# Patient Record
Sex: Female | Born: 1945 | Race: White | Hispanic: No | Marital: Married | Smoking: Never smoker
Health system: Southern US, Academic
[De-identification: ages and names within clinical notes are randomized; demographics above are authoritative.]

## PROBLEM LIST (undated history)

## (undated) HISTORY — PX: HX HIP SURGERY: 2100001310

## (undated) HISTORY — PX: HX BACK SURGERY: SHX140

## (undated) HISTORY — PX: HX KNEE SURGERY: 2100001320

---

## 1989-04-24 ENCOUNTER — Other Ambulatory Visit (HOSPITAL_COMMUNITY): Payer: Self-pay

## 2014-10-25 DIAGNOSIS — I82409 Acute embolism and thrombosis of unspecified deep veins of unspecified lower extremity: Secondary | ICD-10-CM | POA: Insufficient documentation

## 2015-02-04 DIAGNOSIS — I10 Essential (primary) hypertension: Secondary | ICD-10-CM | POA: Insufficient documentation

## 2015-12-13 DIAGNOSIS — K219 Gastro-esophageal reflux disease without esophagitis: Secondary | ICD-10-CM | POA: Insufficient documentation

## 2015-12-13 DIAGNOSIS — F319 Bipolar disorder, unspecified: Secondary | ICD-10-CM | POA: Insufficient documentation

## 2016-08-02 DIAGNOSIS — N183 Chronic kidney disease, stage 3 unspecified (CMS HCC): Secondary | ICD-10-CM | POA: Insufficient documentation

## 2018-03-31 IMAGING — MG 3D SCREENING MAMMO BIL W/CAD
5 series · 6 of 24 positions shown · non-contrast
Comparison: 07/14/2018 and 08/09/2014.

------------- REPORT [id number] -------------
PATIENT REGISTRATION FORM

Scheduled Modality:
MG
Date Scheduled:
Referring Physician: 
PATIENT INFORMATION
 Mr.
 Mrs.
 Miss  Ms.
Email address:
[place], [place]
Social Security:  
Age:
Sex:
F
Mailing Address:
Home Phone
[place] [address]
Study Type:
Diagnosis / Symptoms:
Insurance Authorization:
PREV, REVOKICH, FAXING ORDER
Notes/Special Instructions: 
INSURANCE INFORMATION
(Please give your insurance card to the receptionist.)
Name of  primary insurance
[employer] [place]
Subscriber’s S.S. #
Group #
Insurance ID # 
Co-payment:
$
Patient’s relationship to subscriber:
 Self
 Spouse
 Child
 Other
Name of secondary insurance (if applicable):
Insurance ID #
[employer] [place]
EMILIJANO
IN CASE OF EMERGENCY
Name of friend or relative to call in case of emergency:
Relationship to patient:
Home phone #
Work phone #
The above information is true to the best of my knowledge. I authorize my insurance benefits be paid directly to the physician. I understand that I am financially responsible for any balance. I also authorize [employer] or insurance company to release any information required to process my claims.
Patient/Guardian signature
Date
------------- REPORT [id number] -------------
[facility]
[address]
Daina Ms.MULULU, ERKKISON:
We wish to report the following on your recent mammography examination. We are sending a report to your referring physician or other health care provider. 
(       Normal/Negative:
No evidence of cancer.
This statement is mandated by the [place], Department of Health.
Your examination was performed by one of our technologists, who are registered radiological technologists and also specially certified in mammography:
___
Parlak, Edaly (M)
Dang, Mcalex (M)
Your mammogram was interpreted by our radiologist.
( 
Sofeine Made, M.D.
(Annual Breast Examination by a physician or other health care provider
(Annual Mammography Screening beginning at age 40
(Monthly Breast Self Examination
------------- REPORT [id number] -------------
EXAM:  3D BILATERAL ANNUAL SCREENING DIGITAL MAMMOGRAM WITH TOMOSYNTHESIS AND CAD
INDICATION: Screening.

[Series 4211: R CC · right · 0.10mm/px · 2 of 2 slices shown]
[im 1/2]
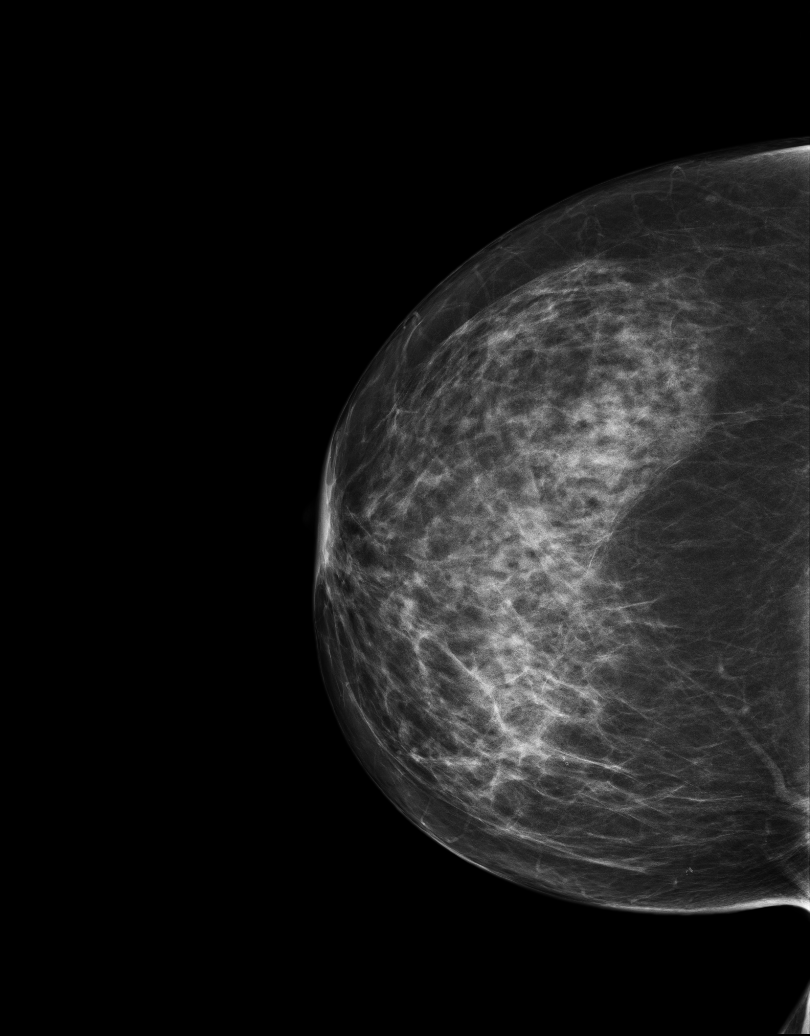
[im 2/2]
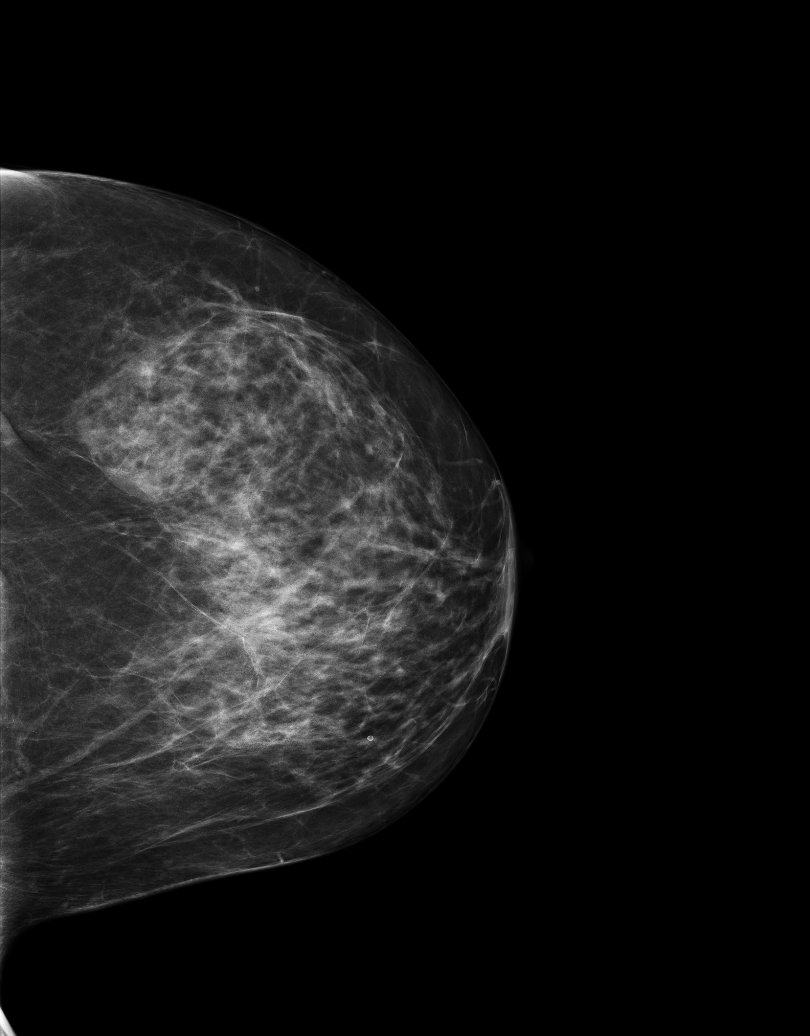

[3D SCREENING MAMMO BIL W/CAD (1 of 2) · tomo slice 13/80.0]
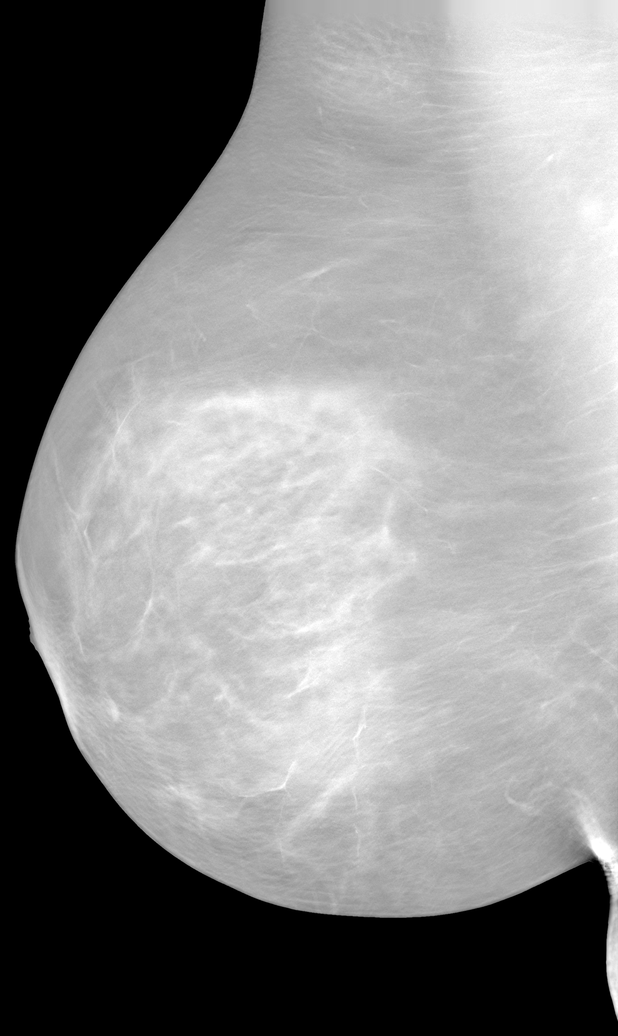

[R]
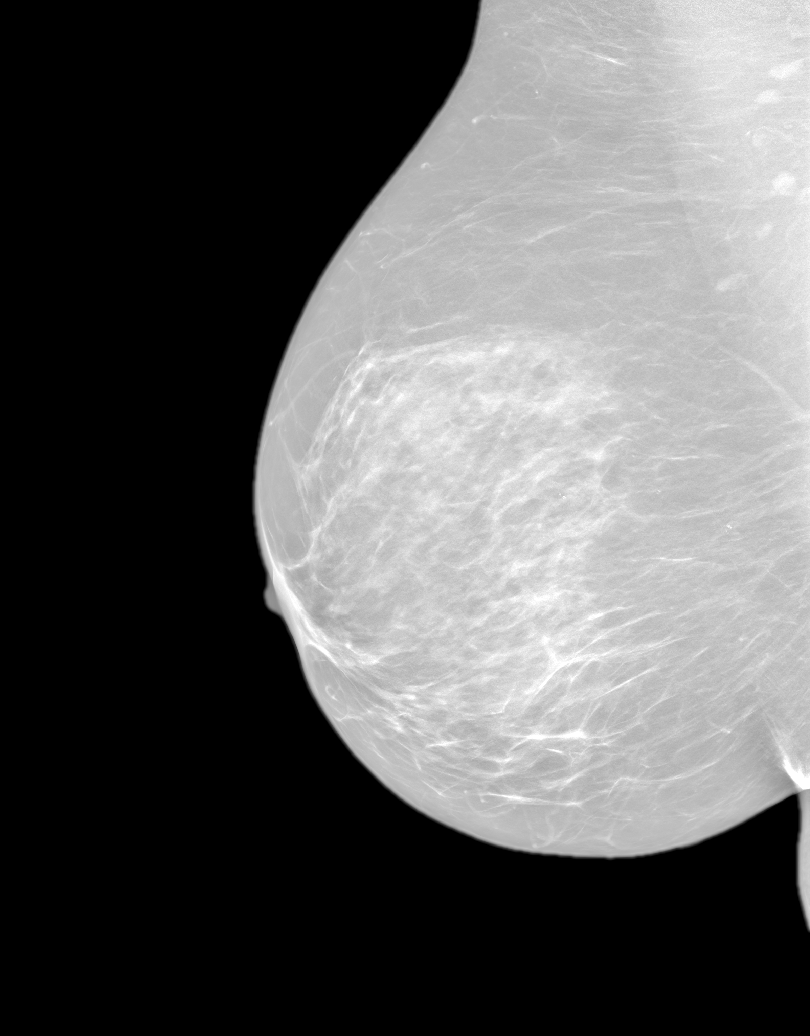

[3D SCREENING MAMMO BIL W/CAD (2 of 2) · tomo slice 12/75.0]
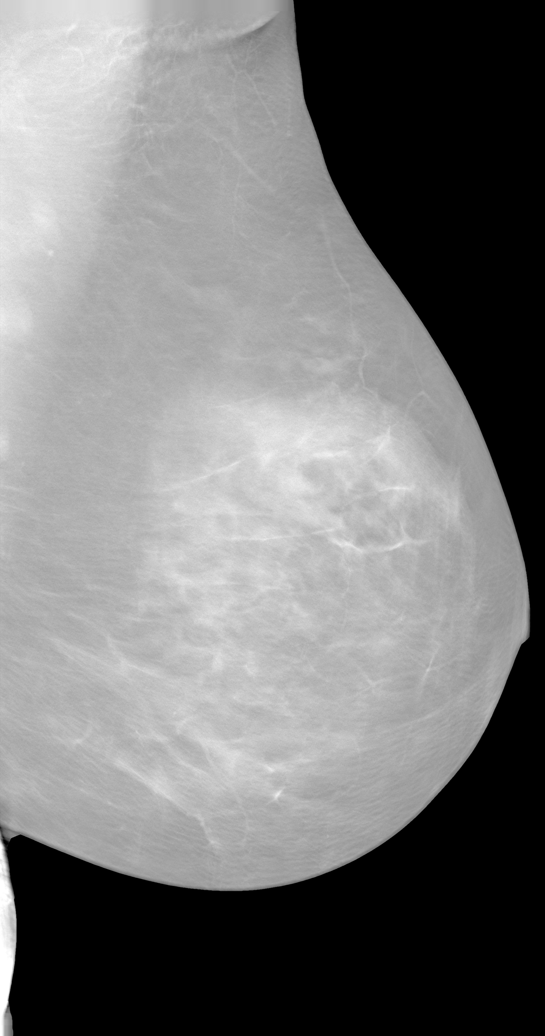

[L]
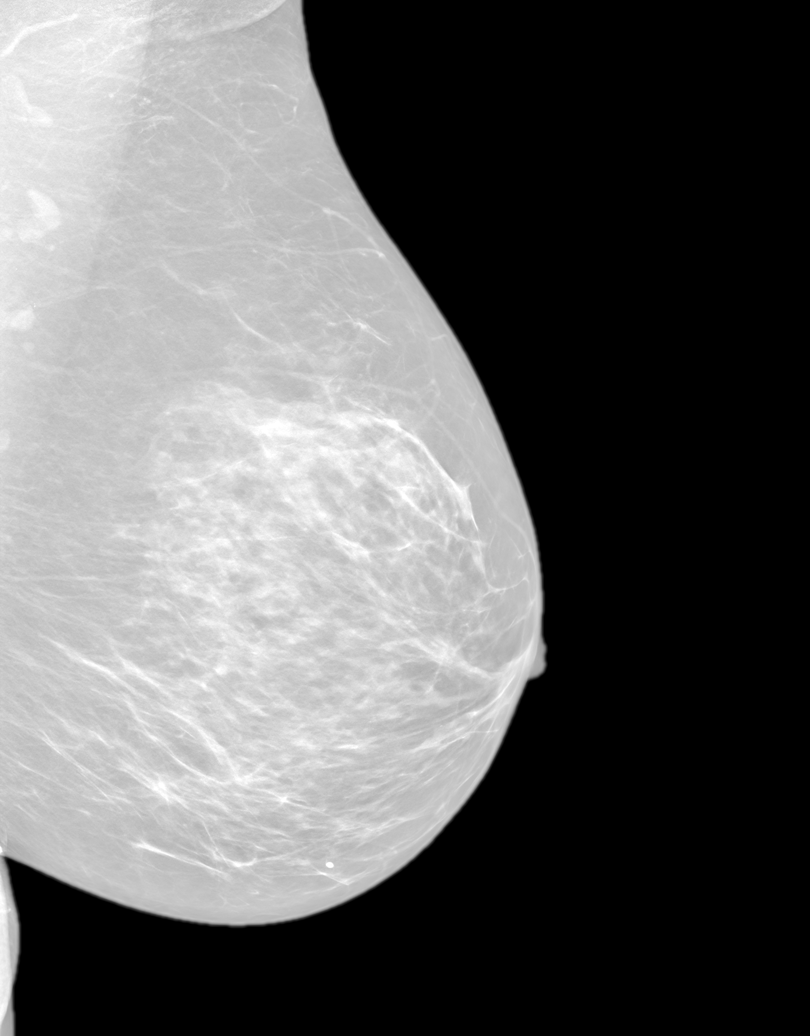

[6 of 24 positions shown; findings below may reference images not displayed]

FINDINGS: Breast parenchyma is heterogeneously dense.  There is no mass or suspicious cluster of microcalcifications.   There is no architectural distortion, skin thickening or nipple retraction.
IMPRESSION: 1.  BIRADS 2-Benign findings. Patient has been added in a reminder system with a target date for the next screening mammography.

2.  DENSITY CODE – C (Heterogeneously dense).  

Final Assessment Code:

Bi-Rads 2 

BI-RADS 0
Need additional imaging evaluation

BI-RADS 1
Negative mammogram

BI-RADS 2
Benign finding

BI-RADS 3
Probably benign finding: short-interval follow-up suggested

BI-RADS 4
Suspicious abnormality:  biopsy should be considered

BI-RADS 5
Highly suggestive of malignancy; appropriate action should be taken

BI-RADS 6
Known Biopsy-proven Malignancy – Appropriate action should be taken

NOTE:
In compliance with Federal regulations, the results of this mammogram are being sent to the patient.

## 2020-07-05 IMAGING — MG 3D SCREENING MAMMO BIL W/CAD
5 series · 7 of 24 positions shown · non-contrast
Comparison: Mammograms dated 10/06/2018, 10/13/2013.

------------- REPORT [id number] -------------
[facility]
[address]
Daina Ms.MULULU, ERKKISON:
We wish to report the following on your recent mammography examination. We are sending a report to your referring physician or other health care provider. 
(       Normal/Negative:
No evidence of cancer.
This statement is mandated by the [place], Department of Health.
Your examination was performed by one of our technologists, who are registered radiological technologists and also specially certified in mammography:
___
Parlak, Edaly (M)
Nepomuceno, Martinez (M)

Your mammogram was interpreted by our radiologist.
( 
Sofeine Made, M.D.
(Annual Breast Examination by a physician or other health care provider
(Annual Mammography Screening beginning at age 40
(Monthly Breast Self Examination
------------- REPORT [id number] -------------
EXAM:  BILATERAL DIGITAL SCREENING MAMMOGRAM WITH 3D TOMOSYNTHESIS AND COMPUTER ASSISTED DIAGNOSIS
INDICATION: Asymptomatic 73-year-old with family history of breast cancer in her sister.  Lifetime breast cancer risk 8.2%.
TECHNIQUE: 2D mammogram in CC projection both breasts. 3D Tomosynthesis of both breasts in the MLO projection. Virtual 2D images were obtained from the 3D images in the MLO projection.

[L]
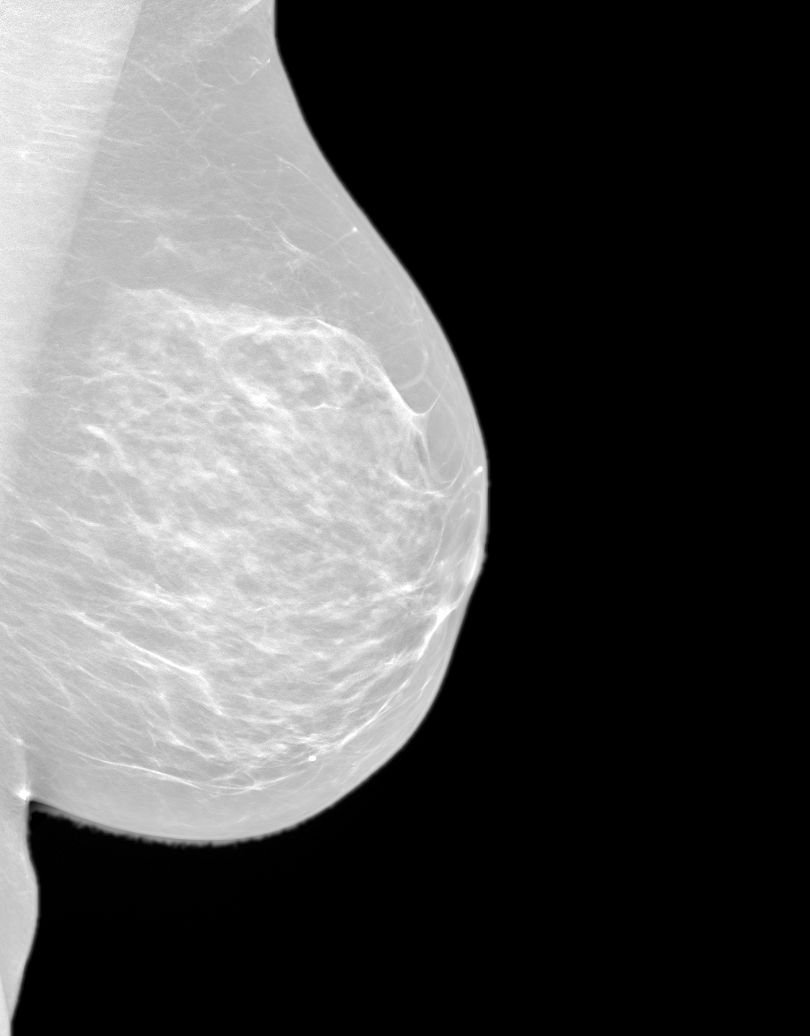

[Series 8021: R CC · right · 2 of 2 slices shown]
[im 1/2]
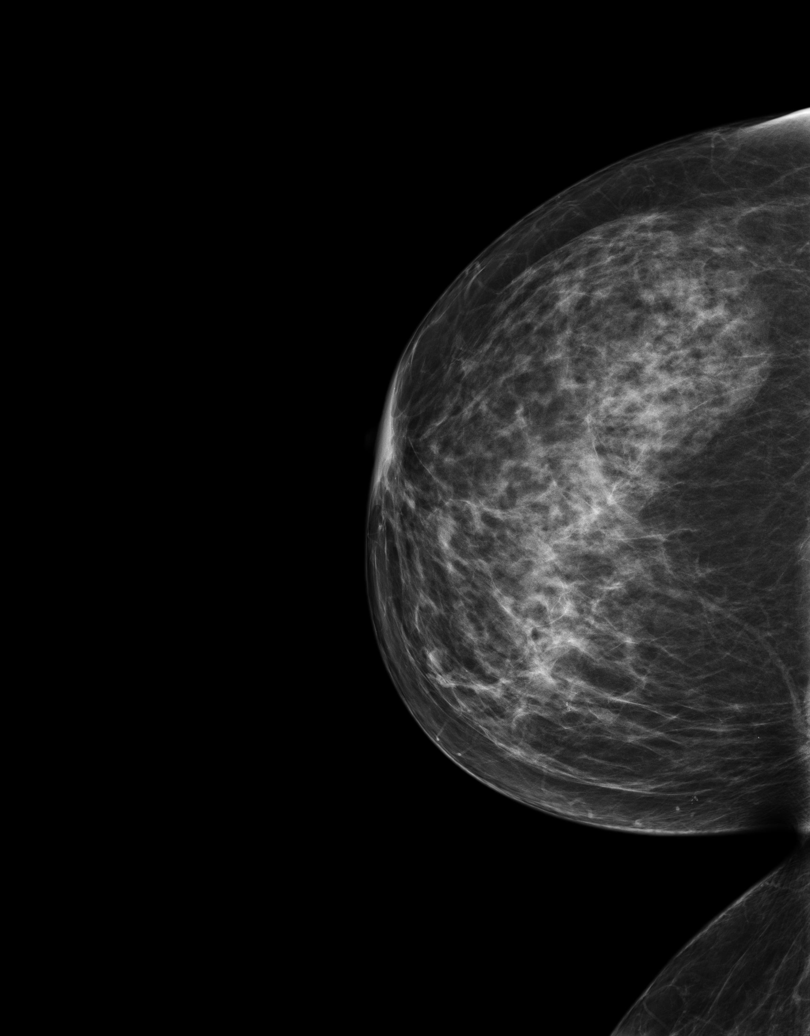
[im 2/2]
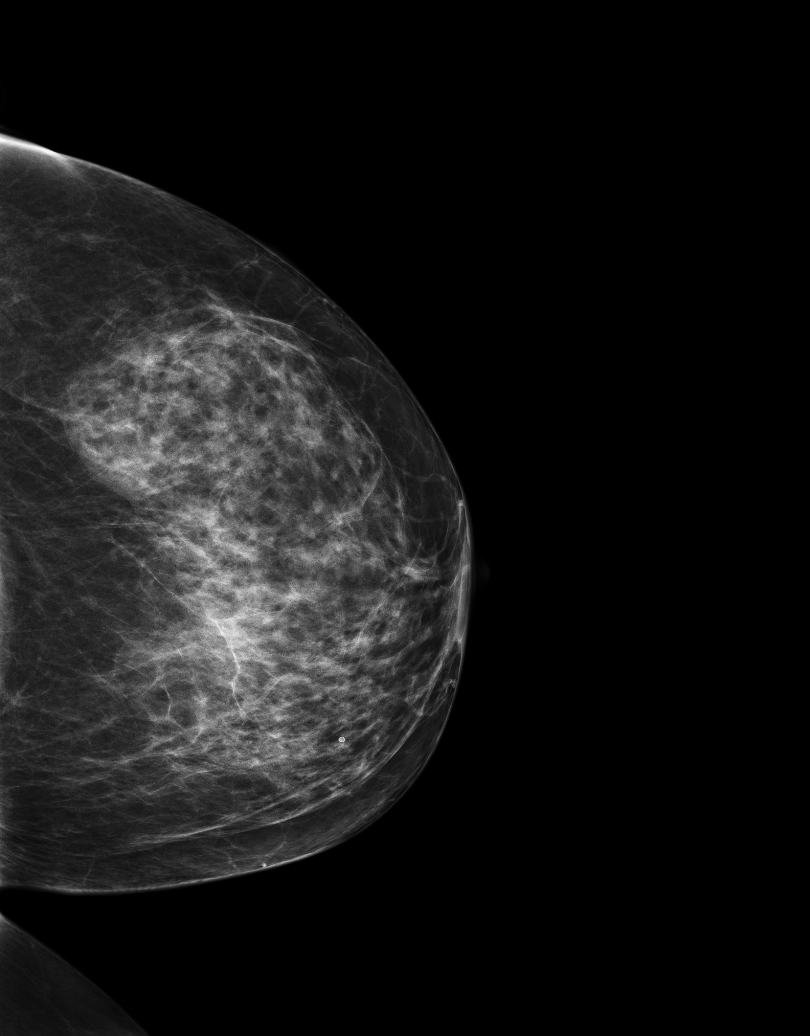

[Series 8023: 3D SCREENING MAMMO BIL W/CAD · 2 acquisitions, 2 frames shown (1 of 2)]
[im 1/2]
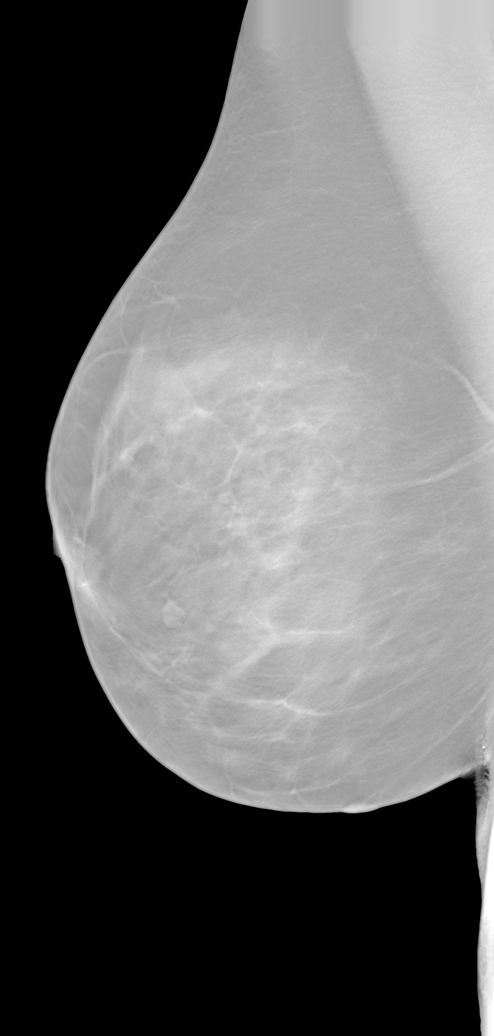
[im 2/2]
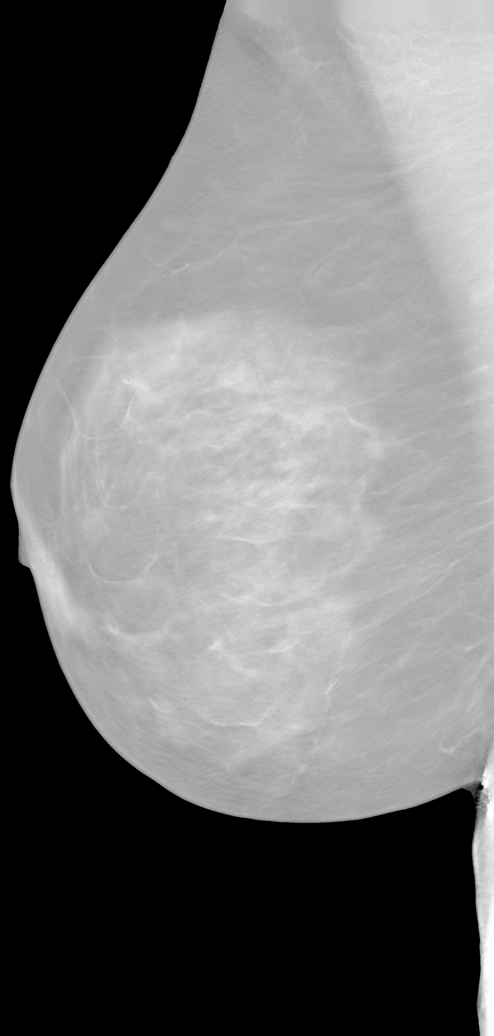

[3D SCREENING MAMMO BIL W/CAD (2 of 2) · tomo slice 11/70.0]
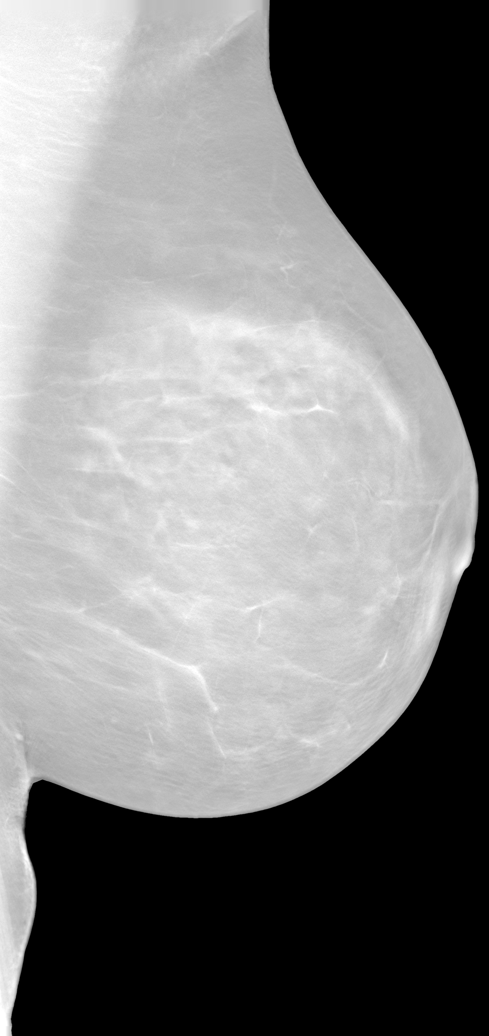

[R]
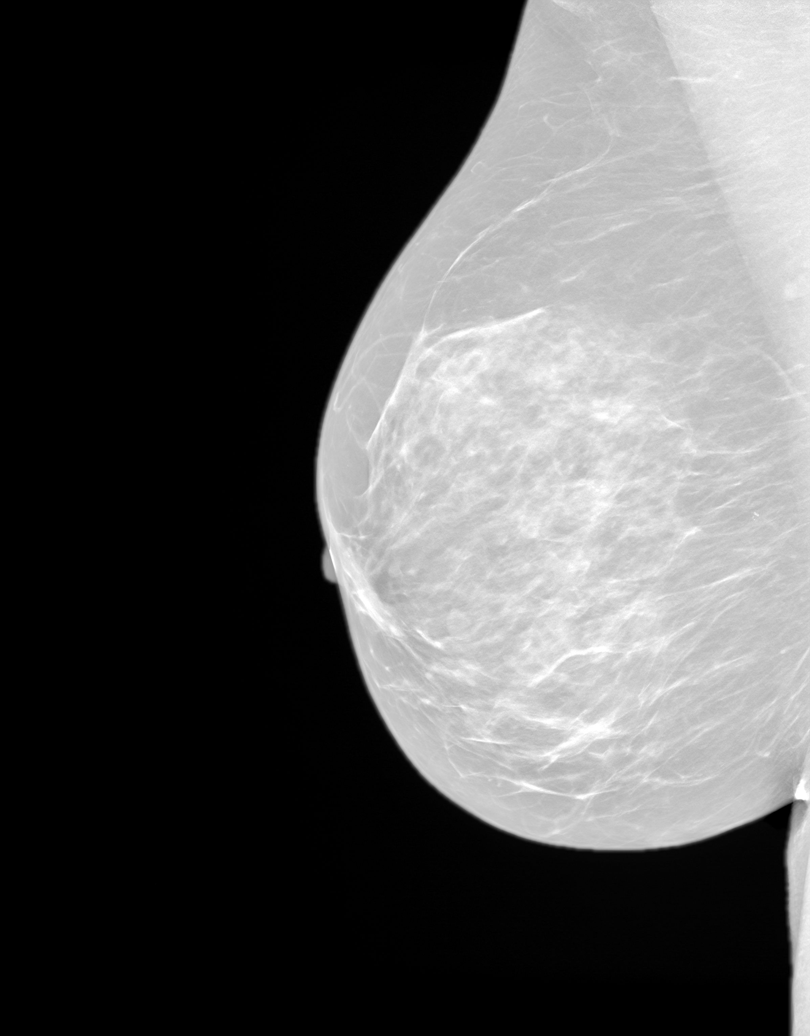

[7 of 24 positions shown; findings below may reference images not displayed]

FINDINGS: No focal mass or architectural changes are noted.  Asymmetry of density in the superior left breast compared to the right is stable.  No abnormal calcific densities, skin changes, nipple changes or duct dilation are seen.
IMPRESSION: 1.  Stable mammographic findings.  Heterogeneously dense breasts limits sensitivity to mammogram.  

2.  BIRADS 2-Benign findings. Patient has been added in a reminder system with a target date for the next screening mammography.

3.  DENSITY CODE – C (Heterogeneously dense). 

Final Assessment Code:

Bi-Rads 2 

BI-RADS 0
Need additional imaging evaluation.

BI-RADS 1
Negative mammogram.

BI-RADS 2
Benign finding.

BI-RADS 3
Probably benign finding; short-interval follow-up suggested.

BI-RADS 4
Suspicious abnormality; biopsy should be considered.

BI-RADS 5
Highly suggestive of malignancy; appropriate action should be taken.

BI-RADS 6
Known biopsy-proven malignancy; appropriate action should be taken. 

NOTE:
In compliance with Federal regulations, the results of this mammogram are being sent to the patient.

## 2021-04-18 IMAGING — MR MRI CERVICAL SPINE WITHOUT CONTRAST
6 series · 28 of 48 positions shown · IV contrast (gadolinium)
Comparison: None available.

﻿EXAM:  [id number]   MRI CERVICAL SPINE WITHOUT CONTRAST
INDICATION: Neck pain with right upper extremity radiculopathy.
TECHNIQUE: Multiplanar multisequential MRI of the cervical spine was performed without gadolinium contrast.

[Series 4: s-map · sagittal · 8.8mm · 4.38mm/px · 8 of 100 slices shown]
[im 4/100]
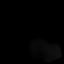
[im 16/100]
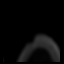
[im 32/100]
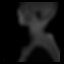
[im 44/100]
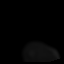
[im 56/100]
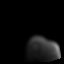
[im 68/100]
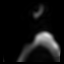
[im 84/100]
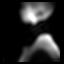
[im 96/100]
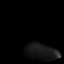

[Series 5: T2 · sagittal · 3.0mm · 0.75mm/px · 4 of 15 slices shown (1 of 2)]
[im 1/15]
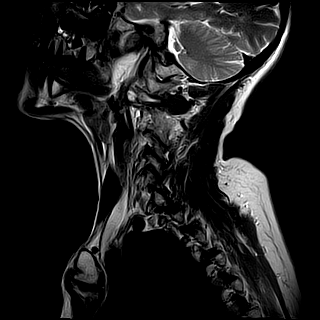
[im 5/15]
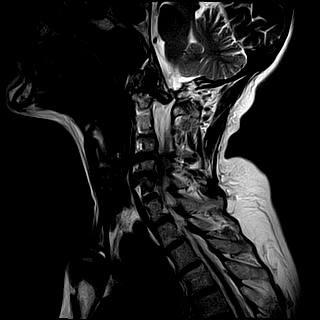
[im 10/15]
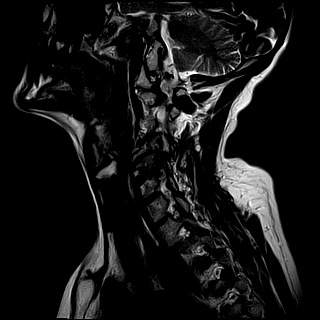
[im 15/15]
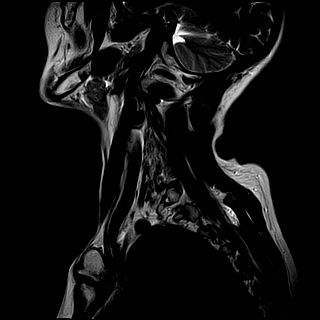

[Series 6: T1 · sagittal · 3.0mm · 0.47mm/px · 4 of 15 slices shown]
[im 1/15]
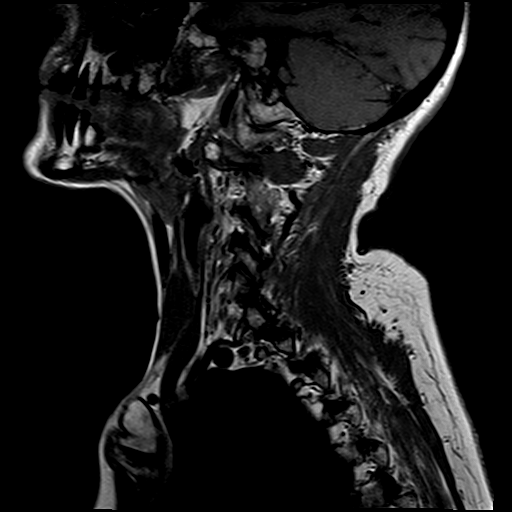
[im 5/15]
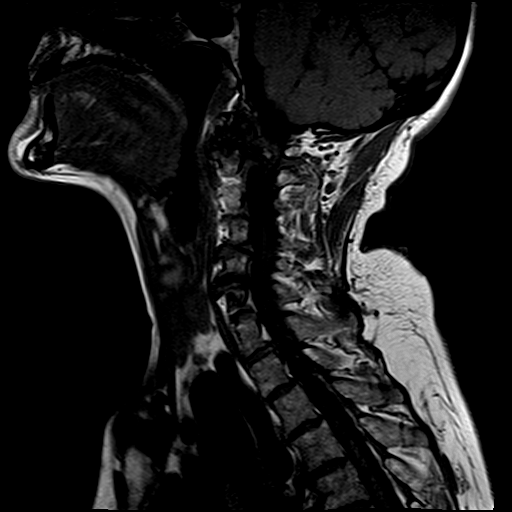
[im 10/15]
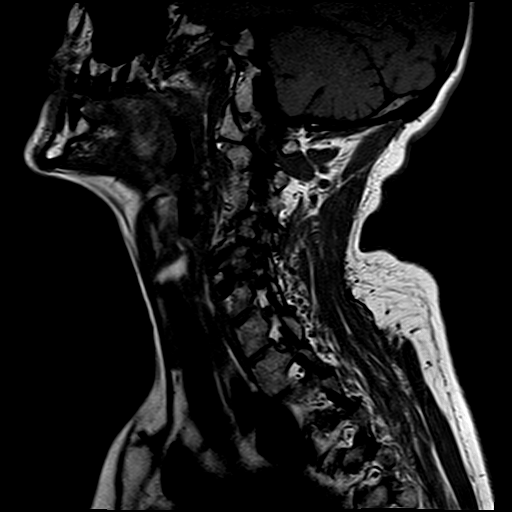
[im 15/15]
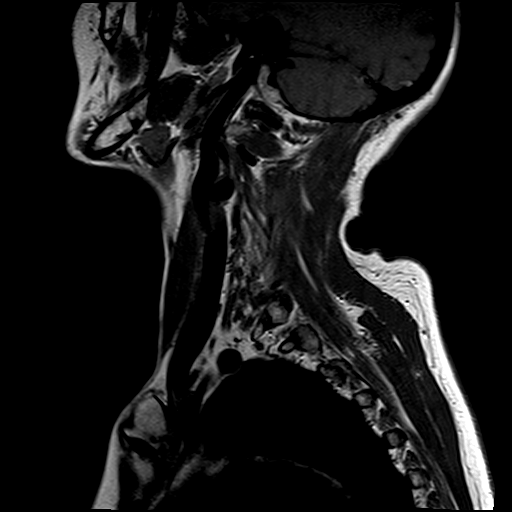

[Series 7: STIR · sagittal · 3.0mm · 0.47mm/px · 4 of 15 slices shown]
[im 1/15]
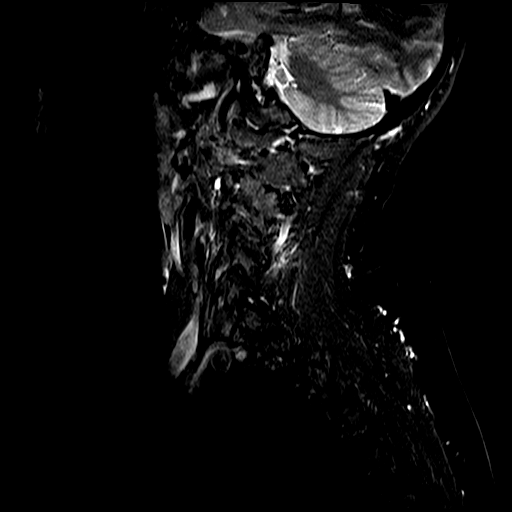
[im 5/15]
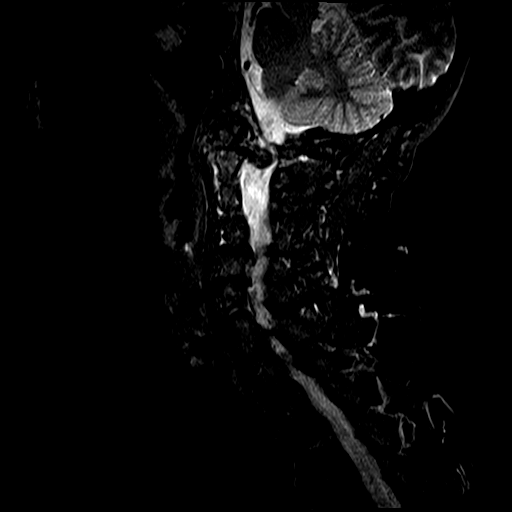
[im 10/15]
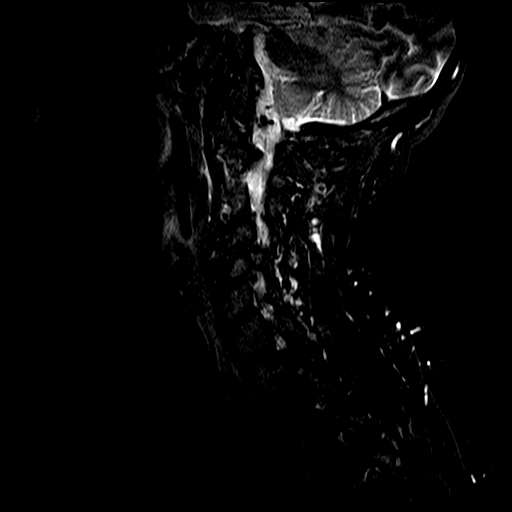
[im 15/15]
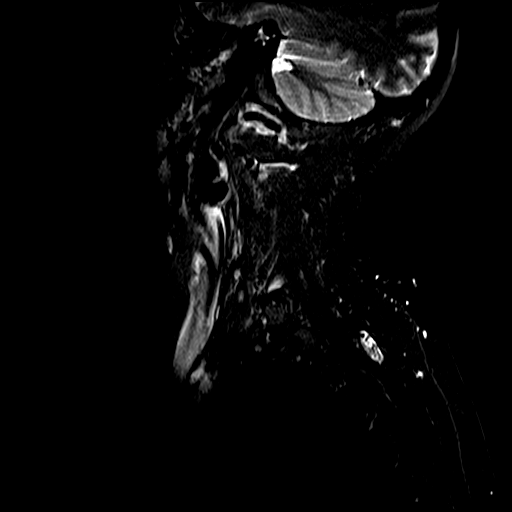

[Series 8: T2-star · axial · 3.0mm · 0.39mm/px · z∈[-98,-37]mm · 3 of 18 slices shown]
[im 1/18]
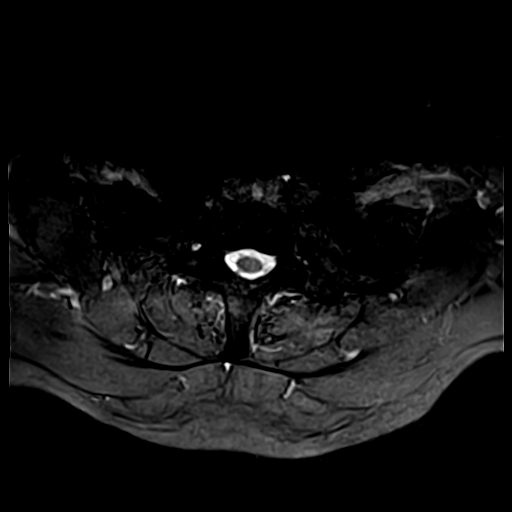
[im 5/18]
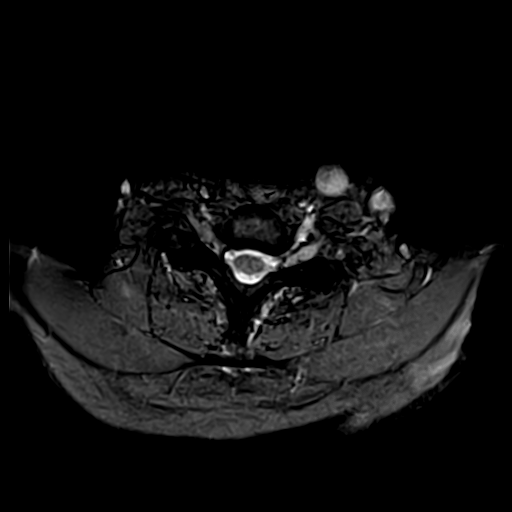
[im 9/18]
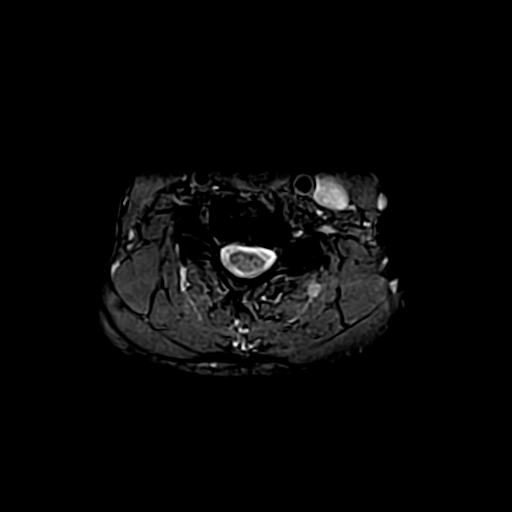

[Series 9: T2 · axial · 3.0mm · 0.39mm/px · z∈[-98,+9]mm · 5 of 18 slices shown (2 of 2)]
[im 1/18]
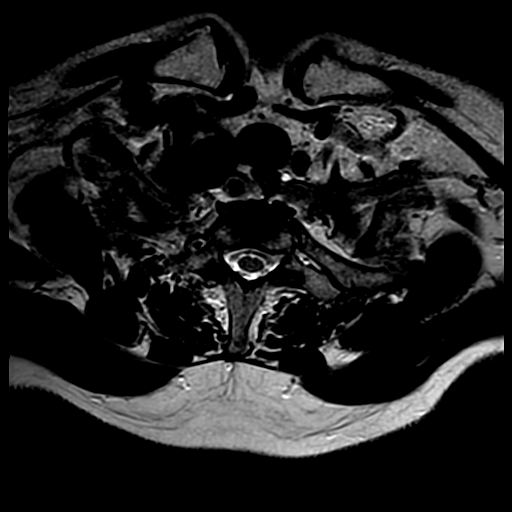
[im 5/18]
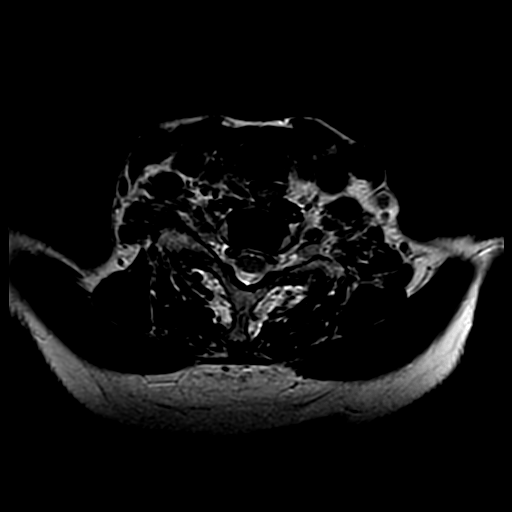
[im 9/18]
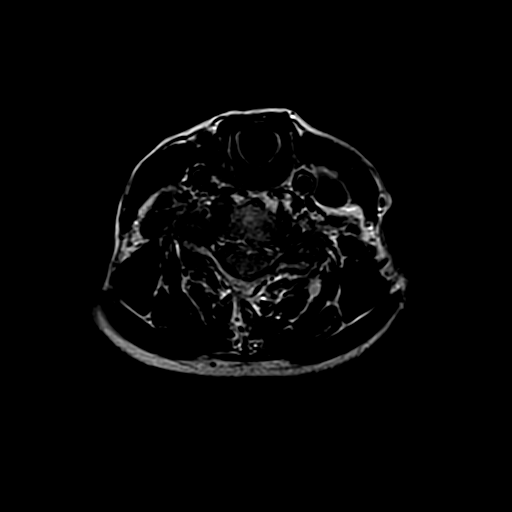
[im 13/18]
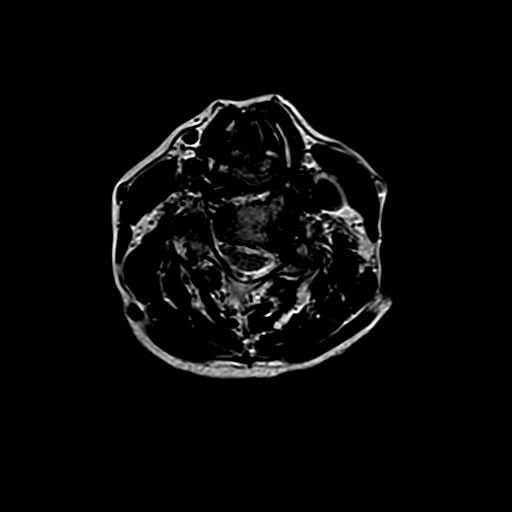
[im 18/18]
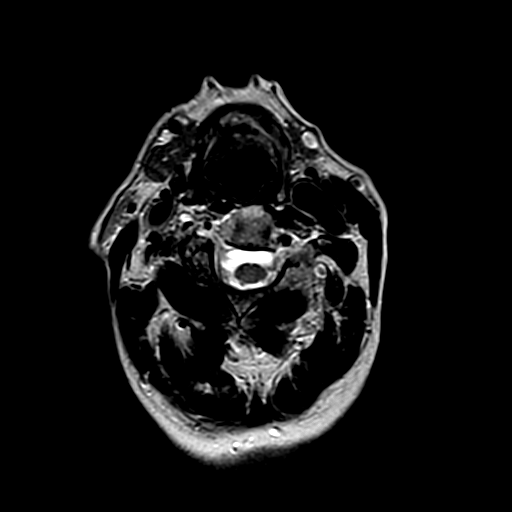

[28 of 48 positions shown; findings below may reference images not displayed]

FINDINGS: Reversal of cervical lordosis is likely positional. Bone marrow signal intensity is normal. There is no acute fracture or subluxation. Visualized spinal cord is also normal in signal intensity without evidence of compression at any level.

C2-3 level is unremarkable.

At C3-4 level, there is minimal anterolisthesis of C3 on C4 vertebral body. There is no significant disc herniation or neural foraminal stenosis.

At C4-5 level, there is marked disc desiccation. There is a small broad-based central disc osteophyte complex with near complete effacement of the ventral CSF. There is severe bilateral neural foraminal stenosis from facet and uncovertebral joint hypertrophy.

At C5-6 level, there is marked disc desiccation. There is a small broad-based central disc osteophyte complex with near complete effacement of the ventral CSF. There is severe right and moderate to severe left neural foraminal stenosis from facet and uncovertebral joint hypertrophy.

At C6-7 level, there is minimal anterolisthesis of C6 on C7 vertebral body. There is no significant disc herniation or neural foraminal stenosis.

C7-T1 level and paraspinal soft tissues are unremarkable.
IMPRESSION: 1. Minimal anterolisthesis of C3 on C4 and C6 on C7 vertebral body. 

2. Near complete effacement of the ventral CSF at C4-5 and C5-6 levels from small central disc osteophyte complexes. 

3. Multilevel neural foraminal stenosis as detailed above.

## 2022-03-09 ENCOUNTER — Encounter (INDEPENDENT_AMBULATORY_CARE_PROVIDER_SITE_OTHER): Payer: Self-pay | Admitting: Nephrology

## 2022-03-09 DIAGNOSIS — M92212 Osteochondrosis (juvenile) of carpal lunate [Kienbock], left hand: Secondary | ICD-10-CM | POA: Insufficient documentation

## 2022-03-09 DIAGNOSIS — E559 Vitamin D deficiency, unspecified: Secondary | ICD-10-CM

## 2022-03-09 DIAGNOSIS — N183 Chronic kidney disease, stage 3 unspecified (CMS HCC): Secondary | ICD-10-CM

## 2022-03-09 DIAGNOSIS — R7303 Prediabetes: Secondary | ICD-10-CM | POA: Insufficient documentation

## 2022-03-09 NOTE — Progress Notes (Signed)
Chronic kidney disease  -Stage IIIA  -Due to age  -Creatinine is stable  -Baseline creatinine 1.1  -We will check UA  -Total protein to creatinine ratio not significant  -CBC and a basic metabolic panel  -Renal ultrasound  -Return to clinic in 6 months  -Continue low-sodium diet  -Fluid restriction less than 40 ounces a day  -Avoid NSAIDs     CKD bone mineral disease  -PTH  -Vitamin D level  Continue to monitor        Hypertension  -Blood pressure is at goal  -Goal less than 130/80  -She is not on lisinopril/ARBs  -Low-salt diet        I have discussed with the patient the following issues:  The main goal of treatment is to prevent progression of CKD to complete kidney failure. The best way to do this is to control the underlying cause.     the optimal diet is one similar to the Dietary Approaches to Stop Hypertension (DASH) diet, consisting of fruits, vegetables, legumes, fish, poultry, and whole grains.     Restrict sodium intake to less than 2 g/day     Restrict  fluids / free water intake to less then 2.0 L/day

## 2022-03-12 ENCOUNTER — Other Ambulatory Visit: Payer: Medicare Other | Attending: Nephrology | Admitting: Nephrology

## 2022-03-12 ENCOUNTER — Other Ambulatory Visit: Payer: Self-pay

## 2022-03-12 ENCOUNTER — Ambulatory Visit (INDEPENDENT_AMBULATORY_CARE_PROVIDER_SITE_OTHER): Payer: Medicare Other

## 2022-03-12 DIAGNOSIS — N183 Chronic kidney disease, stage 3 unspecified (CMS HCC): Secondary | ICD-10-CM

## 2022-03-12 DIAGNOSIS — E559 Vitamin D deficiency, unspecified: Secondary | ICD-10-CM | POA: Insufficient documentation

## 2022-03-12 LAB — BASIC METABOLIC PANEL
ANION GAP: 7 mmol/L — ABNORMAL LOW (ref 10–20)
BUN/CREA RATIO: 23 — ABNORMAL HIGH (ref 6–22)
BUN: 28 mg/dL — ABNORMAL HIGH (ref 7–25)
CALCIUM: 9.9 mg/dL (ref 8.6–10.3)
CHLORIDE: 110 mmol/L — ABNORMAL HIGH (ref 98–107)
CO2 TOTAL: 25 mmol/L (ref 21–31)
CREATININE: 1.2 mg/dL (ref 0.60–1.30)
ESTIMATED GFR: 47 mL/min/{1.73_m2} — ABNORMAL LOW (ref >59)
GLUCOSE: 86 mg/dL (ref 74–109)
OSMOLALITY, CALCULATED: 288 mOsm/kg (ref 270–290)
POTASSIUM: 4.2 mmol/L (ref 3.5–5.1)
SODIUM: 142 mmol/L (ref 136–145)

## 2022-03-12 LAB — CBC WITH DIFF
BASOPHIL #: 0 10*3/uL (ref 0.00–2.50)
BASOPHIL %: 1 % (ref 0–3)
EOSINOPHIL #: 0.1 10*3/uL (ref 0.00–2.40)
EOSINOPHIL %: 2 % (ref 0–7)
HCT: 40.6 % (ref 37.0–47.0)
HGB: 14.5 g/dL (ref 12.5–16.0)
LYMPHOCYTE #: 1.9 10*3/uL — ABNORMAL LOW (ref 2.10–11.00)
LYMPHOCYTE %: 29 % (ref 25–45)
MCH: 33.9 pg — ABNORMAL HIGH (ref 27.0–32.0)
MCHC: 35.6 g/dL (ref 32.0–36.0)
MCV: 95.2 fL (ref 78.0–99.0)
MONOCYTE #: 0.5 10*3/uL (ref 0.00–4.10)
MONOCYTE %: 7 % (ref 0–12)
MPV: 8.5 fL (ref 7.4–10.4)
NEUTROPHIL #: 3.9 10*3/uL — ABNORMAL LOW (ref 4.10–29.00)
NEUTROPHIL %: 61 % (ref 40–76)
PLATELETS: 271 10*3/uL (ref 140–440)
RBC: 4.26 10*6/uL (ref 4.20–5.40)
RDW: 13.7 % (ref 11.6–14.8)
WBC: 6.4 10*3/uL (ref 4.0–10.5)
WBCS UNCORRECTED: 6.4 10*3/uL

## 2022-03-12 LAB — PARATHYROID HORMONE (PTH): PTH: 76.3 pg/mL (ref 12.0–88.0)

## 2022-03-12 LAB — PROTEIN/CREATININE RATIO, URINE, RANDOM
CREATININE RANDOM URINE: 219 mg/dL — ABNORMAL HIGH (ref 11–26)
PROTEIN RANDOM URINE: 13 mg/dL — ABNORMAL LOW (ref 50–80)
PROTEIN/CREATININE RATIO RANDOM URINE: 0.059 mg/mg (ref 0.000–200.000)

## 2022-03-12 LAB — VITAMIN D 25 TOTAL: VITAMIN D: 32 ng/mL (ref 30–100)

## 2022-03-12 LAB — MICROALBUMIN URINE, RANDOM: MICROALBUMIN RANDOM URINE: 1.1 mg/dL

## 2022-03-17 LAB — VITAMIN D CALCITROL, 1,25 HYDROXYVITAMIN D
VITAMIN D,1,25 (OH)2,TOTAL: 46 pg/mL (ref 18–72)
VITAMIN D2, 1,25 (OH)2: <8 pg/mL
VITAMIN D3, 1,25 (OH)2: 46 pg/mL

## 2022-03-20 ENCOUNTER — Other Ambulatory Visit: Payer: Self-pay

## 2022-03-20 ENCOUNTER — Encounter (INDEPENDENT_AMBULATORY_CARE_PROVIDER_SITE_OTHER): Payer: Self-pay | Admitting: Nephrology

## 2022-03-20 ENCOUNTER — Ambulatory Visit (INDEPENDENT_AMBULATORY_CARE_PROVIDER_SITE_OTHER): Payer: Medicare Other | Admitting: Nephrology

## 2022-03-20 VITALS — BP 120/62 | HR 76 | Ht 67.0 in | Wt 174.0 lb

## 2022-03-20 DIAGNOSIS — N1831 Chronic kidney disease, stage 3a (CMS HCC): Secondary | ICD-10-CM

## 2022-03-20 DIAGNOSIS — I10 Essential (primary) hypertension: Secondary | ICD-10-CM

## 2022-03-20 DIAGNOSIS — I129 Hypertensive chronic kidney disease with stage 1 through stage 4 chronic kidney disease, or unspecified chronic kidney disease: Secondary | ICD-10-CM

## 2022-03-20 NOTE — Progress Notes (Signed)
[facility]    Progress Note    Name: Donna Roberts MRN:  [id number]   Date: 03/20/2022 Age: 76 y.o.          Nephrology Follow Up Visit          HPI: 75 y.o. Pleasant female with past medical history of CKD stage IIIA, hypertension, anxiety here for follow-up on chronic kidney disease stage IIIA.  Patient denies nausea or vomiting.  No diarrhea.  Reports chronic edema in the left lower extremity due to blood clots in the past.  Patient denies any other complaints.  No dysuria.  No blood in the urine.  No nausea or vomiting.  Discussed with the patient the findings her lab work all questions were answered.      ROS:         Systematic review of 12 organ systems was negative except what mentioned in in the HPI.      OBJECTIVE:   BP 120/62 (Site: Upper Extremity, Patient Position: Sitting, Cuff Size: Adult)   Pulse 76   Ht 1.702 m (5\' 7" )   Wt 78.9 kg (174 lb)   BMI 27.25 kg/m       General:  NAD, AAOx3  HEENT:  EOMI, MMM, no pallor, no icterus  NECK: No increased JVD.    HEART: Normal S1 and S2. Regular rhythm. No murmurs or rubs.   LUNGS: Clear to auscultation bilateral. No wheezes, rales, or rhonchi.   ABDOMEN: +BS, Soft, nontender and nondistended. No rebound or guarding present.   EXTREMITIES: No edema. No asterixis    NEURO : moving all extremities. emoi  SKIN: No obvious skin rashes.    LABORATORY DATA:   Lab Results   Component Value Date    BUN 28 (H) 03/12/2022    CREATININE 1.20 03/12/2022    BUNCRRATIO 23 (H) 03/12/2022    GFR 47 (L) 03/12/2022    SODIUM 142 03/12/2022    POTASSIUM 4.2 03/12/2022    CHLORIDE 110 (H) 03/12/2022    CO2 25 03/12/2022    ANIONGAP 7 (L) 03/12/2022    CALCIUM 9.9 03/12/2022    HGB 14.5 03/12/2022    HCT 40.6 03/12/2022    INTACTPTH 76.3 03/12/2022       No results found for: MICROALBUMIN, TOTPROTCREAT, HA1C, URICACID         MEDICATIONS:  No outpatient medications have been marked as taking for the 03/20/22 encounter (Office Visit) with  Beather Arbour, MD.     Current Outpatient Medications   Medication Instructions   . ARIPiprazole (ABILIFY) 10 mg Oral Tablet aripiprazole 10 mg tablet   . atenoloL (TENORMIN) 25 mg Oral Tablet atenolol 25 mg tablet   . atorvastatin (LIPITOR) 80 mg Oral Tablet atorvastatin 80 mg tablet   . citalopram (CELEXA) 40 mg Oral Tablet No dose, route, or frequency recorded.   . DULoxetine (CYMBALTA DR) 30 mg Oral Capsule, Delayed Release(E.C.) duloxetine 30 mg capsule,delayed release   . FLUoxetine (PROZAC) 40 mg Oral Capsule fluoxetine 40 mg capsule   . lamoTRIgine (LAMICTAL) 25 mg Oral Tablet lamotrigine 25 mg tablet   . lisinopriL-hydrochlorothiazide (ZESTORETIC) 20-12.5 mg Oral Tablet lisinopril 20 mg-hydrochlorothiazide 12.5 mg tablet   . metoprolol tartrate (LOPRESSOR) 50 mg Oral Tablet metoprolol tartrate 50 mg tablet   . omeprazole (PRILOSEC) 40 mg Oral Capsule, Delayed Release(E.C.) omeprazole 40 mg capsule,delayed release   . QUEtiapine (SEROQUEL) 50 mg Oral Tablet quetiapine 50 mg tablet   .  rivaroxaban (XARELTO) 20 mg, Oral   . sertraline (ZOLOFT) 100 mg Oral Tablet 07/26/2014 Sertraline Oral  PO 1.0 TABLET(S) daily  07/26/2014  active   . tiZANidine (ZANAFLEX) 2 mg Oral Tablet tizanidine 2 mg tablet   . traMADoL (ULTRAM) 50 mg Oral Tablet tramadol 50 mg tablet   . venlafaxine (EFFEXOR XR) 75 mg Oral Capsule, Sust. Release 24 hr venlafaxine ER 75 mg capsule,extended release 24 hr   . Warfarin (COUMADIN) 4 mg Oral Tablet warfarin 4 mg tablet         ASSESSMENT / PLAN:   ENCOUNTER DIAGNOSES     ICD-10-CM   1. Stage 3a chronic kidney disease (CMS HCC)  N18.31   2. HTN (hypertension), benign  I10                 Chronic kidney disease  -Stage IIIA  -Due to age  -Creatinine is stable 1.2 today.  -Baseline creatinine 1.1  -Total protein to creatinine ratio not significant  -CBC and a basic metabolic panel  -Renal ultrasound ordered and not performed may need to order the test again next year.  Condition is stable  -Return  to clinic in 5-6 months  -Continue low-sodium diet  -Avoid NSAIDs     CKD bone mineral disease/stable  -PTH  -Vitamin D level  -Continue to monitor        Hypertension  -Blood pressure is at goal  -Goal less than 130/80  -She is not on lisinopril/ARBs  -Low-salt diet

## 2022-08-14 ENCOUNTER — Other Ambulatory Visit (INDEPENDENT_AMBULATORY_CARE_PROVIDER_SITE_OTHER): Payer: Self-pay

## 2022-08-14 ENCOUNTER — Other Ambulatory Visit (INDEPENDENT_AMBULATORY_CARE_PROVIDER_SITE_OTHER): Payer: Self-pay | Admitting: Nephrology

## 2022-08-14 DIAGNOSIS — N183 Chronic kidney disease, stage 3 unspecified (CMS HCC): Secondary | ICD-10-CM

## 2022-08-14 DIAGNOSIS — E559 Vitamin D deficiency, unspecified: Secondary | ICD-10-CM

## 2022-08-15 ENCOUNTER — Other Ambulatory Visit: Payer: Medicare Other | Attending: Nephrology | Admitting: Nephrology

## 2022-08-15 ENCOUNTER — Ambulatory Visit (INDEPENDENT_AMBULATORY_CARE_PROVIDER_SITE_OTHER): Payer: Medicare Other

## 2022-08-15 ENCOUNTER — Other Ambulatory Visit: Payer: Self-pay

## 2022-08-15 DIAGNOSIS — N183 Chronic kidney disease, stage 3 unspecified (CMS HCC): Secondary | ICD-10-CM

## 2022-08-15 DIAGNOSIS — E559 Vitamin D deficiency, unspecified: Secondary | ICD-10-CM

## 2022-08-15 LAB — CBC WITH DIFF
BASOPHIL #: 0 10*3/uL (ref 0.00–0.30)
BASOPHIL %: 1 % (ref 0–3)
EOSINOPHIL #: 0.2 10*3/uL (ref 0.00–0.80)
EOSINOPHIL %: 3 % (ref 0–7)
HCT: 40.2 % (ref 37.0–47.0)
HGB: 14.1 g/dL (ref 12.5–16.0)
LYMPHOCYTE #: 1.7 10*3/uL (ref 1.10–5.00)
LYMPHOCYTE %: 31 % (ref 25–45)
MCH: 33.2 pg — ABNORMAL HIGH (ref 27.0–32.0)
MCHC: 35 g/dL (ref 32.0–36.0)
MCV: 94.7 fL (ref 78.0–99.0)
MONOCYTE #: 0.5 10*3/uL (ref 0.00–1.30)
MONOCYTE %: 9 % (ref 0–12)
MPV: 8.7 fL (ref 7.4–10.4)
NEUTROPHIL #: 3.1 10*3/uL (ref 1.80–8.40)
NEUTROPHIL %: 56 % (ref 40–76)
PLATELETS: 221 10*3/uL (ref 140–440)
RBC: 4.24 10*6/uL (ref 4.20–5.40)
RDW: 13.6 % (ref 11.6–14.8)
WBC: 5.4 10*3/uL (ref 4.0–10.5)
WBCS UNCORRECTED: 5.4 10*3/uL

## 2022-08-15 LAB — PARATHYROID HORMONE (PTH): PTH: 61.1 pg/mL (ref 12.0–88.0)

## 2022-08-15 LAB — VITAMIN D 25 TOTAL: VITAMIN D: 50 ng/mL (ref 30–100)

## 2022-08-15 LAB — BASIC METABOLIC PANEL
ANION GAP: 5 mmol/L (ref 4–13)
BUN/CREA RATIO: 21 (ref 6–22)
BUN: 27 mg/dL — ABNORMAL HIGH (ref 7–25)
CALCIUM: 9.6 mg/dL (ref 8.6–10.3)
CHLORIDE: 110 mmol/L — ABNORMAL HIGH (ref 98–107)
CO2 TOTAL: 26 mmol/L (ref 21–31)
CREATININE: 1.28 mg/dL (ref 0.60–1.30)
ESTIMATED GFR: 44 mL/min/{1.73_m2} — ABNORMAL LOW (ref >59)
GLUCOSE: 77 mg/dL (ref 74–109)
OSMOLALITY, CALCULATED: 285 mOsm/kg (ref 270–290)
POTASSIUM: 4.2 mmol/L (ref 3.5–5.1)
SODIUM: 141 mmol/L (ref 136–145)

## 2022-08-15 LAB — PROTEIN/CREATININE RATIO, URINE, RANDOM
CREATININE RANDOM URINE: 190 mg/dL — ABNORMAL HIGH (ref 11–26)
PROTEIN RANDOM URINE: 18 mg/dL — ABNORMAL LOW (ref 50–80)
PROTEIN/CREATININE RATIO RANDOM URINE: 0.095 mg/mg (ref 0.000–200.000)

## 2022-08-15 LAB — MAGNESIUM: MAGNESIUM: 2.2 mg/dL (ref 1.9–2.7)

## 2022-08-15 LAB — MICROALBUMIN URINE, RANDOM: MICROALBUMIN RANDOM URINE: 2.1 mg/dL

## 2022-08-18 LAB — VITAMIN D CALCITROL, 1,25 HYDROXYVITAMIN D
VITAMIN D,1,25 (OH)2,TOTAL: 37 pg/mL (ref 18–72)
VITAMIN D2, 1,25 (OH)2: <8 pg/mL
VITAMIN D3, 1,25 (OH)2: 37 pg/mL

## 2022-08-21 ENCOUNTER — Encounter (INDEPENDENT_AMBULATORY_CARE_PROVIDER_SITE_OTHER): Payer: Self-pay | Admitting: Nephrology

## 2022-10-16 IMAGING — MR MRI LUMBAR SPINE WITHOUT AND WITH CONTRAST
9 series · 48 of 48 positions shown · IV contrast (gadavist)
Comparison: None previous available.

﻿EXAM:  [id number]   MRI LUMBAR SPINE WITHOUT AND WITH CONTRAST
INDICATION: 76-year-old with persistent low back pain and weakness of the lower extremities.  History of back surgery 4 years ago.  No prior malignancy.
TECHNIQUE: Multiplanar, multisequential MRI of the lumbosacral spine was performed including postcontrast study after injection of 9 mL Gadavist IV.

[Series 4: s-map · sagittal · 10.6mm · 5.31mm/px · 20 of 100 slices shown]
[im 1/100]
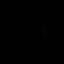
[im 6/100]
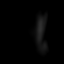
[im 11/100]
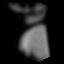
[im 16/100]
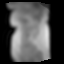
[im 21/100]
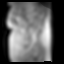
[im 27/100]
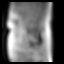
[im 32/100]
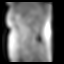
[im 37/100]
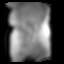
[im 42/100]
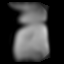
[im 47/100]
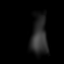
[im 53/100]
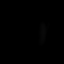
[im 58/100]
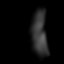
[im 63/100]
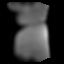
[im 68/100]
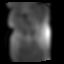
[im 73/100]
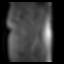
[im 79/100]
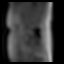
[im 84/100]
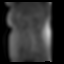
[im 89/100]
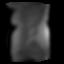
[im 94/100]
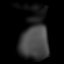
[im 100/100]
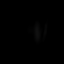

[Series 5: T2 · sagittal · 4.0mm · 0.94mm/px · 3 of 17 slices shown (1 of 3)]
[im 1/17]
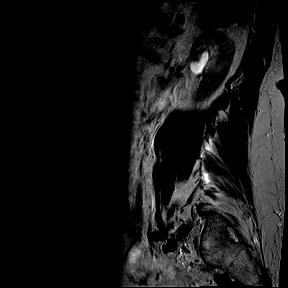
[im 9/17]
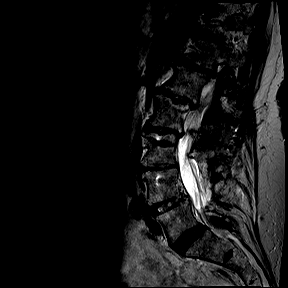
[im 17/17]
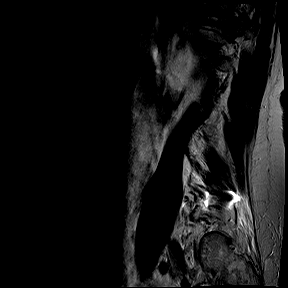

[Series 6: T1 · sagittal · 4.0mm · 0.94mm/px · 3 of 17 slices shown]
[im 1/17]
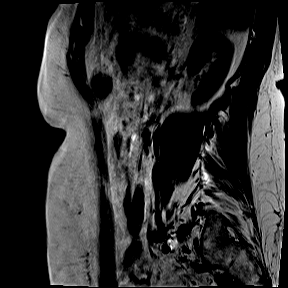
[im 9/17]
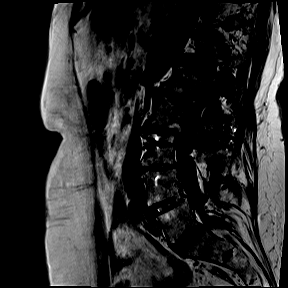
[im 17/17]
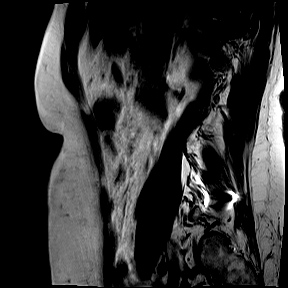

[Series 7: STIR · sagittal · 4.0mm · 1.05mm/px · 3 of 17 slices shown]
[im 1/17]
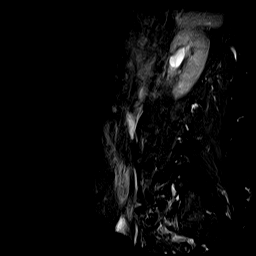
[im 9/17]
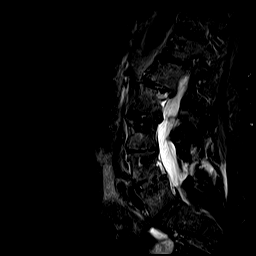
[im 17/17]
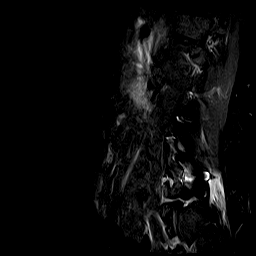

[Series 8: T2 · oblique · 4.0mm · 0.47mm/px · 4 of 23 slices shown (2 of 3)]
[im 1/23]
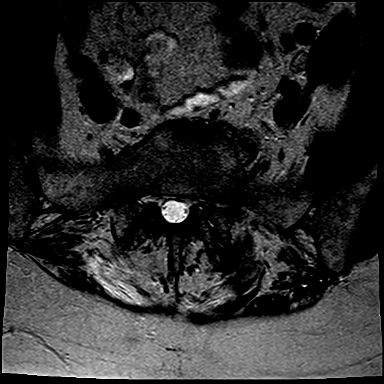
[im 8/23]
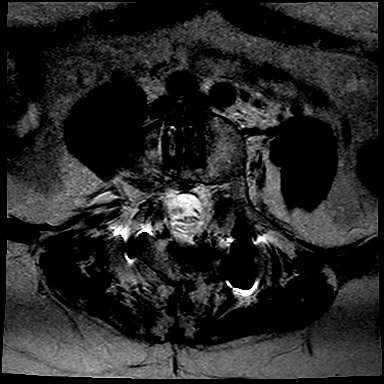
[im 15/23]
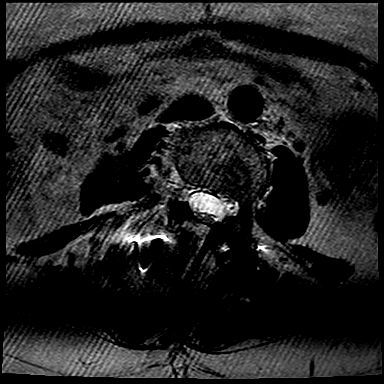
[im 23/23]
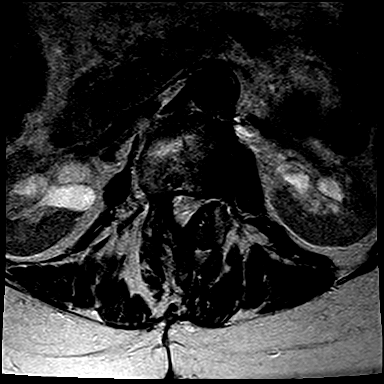

[Series 9: T1 fat-sat · oblique · 4.0mm · 0.70mm/px · 4 of 23 slices shown]
[im 1/23]
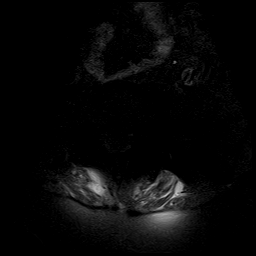
[im 8/23]
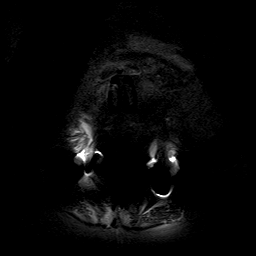
[im 15/23]
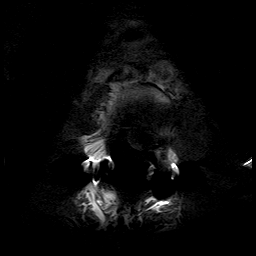
[im 23/23]
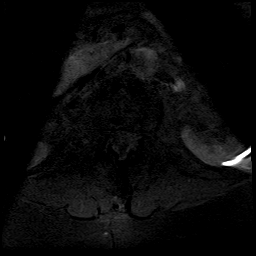

[Series 10: T2 · coronal · 4.0mm · 1.22mm/px · 4 of 20 slices shown (3 of 3)]
[im 1/20]
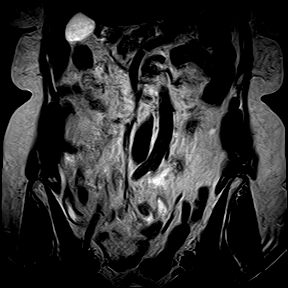
[im 7/20]
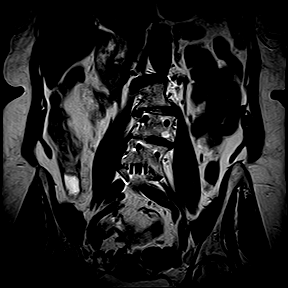
[im 13/20]
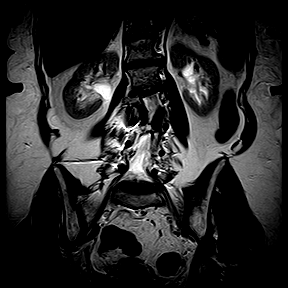
[im 20/20]
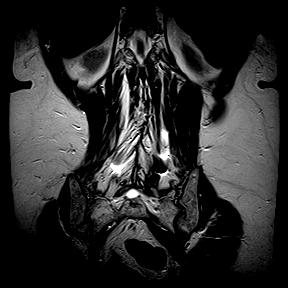

[Series 11: T1 fat-sat post-contrast · sagittal · 4.0mm · 1.05mm/px · 3 of 17 slices shown (1 of 2)]
[im 1/17]
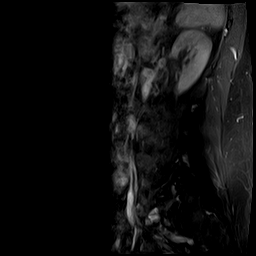
[im 9/17]
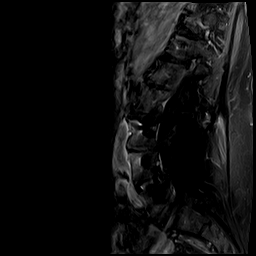
[im 17/17]
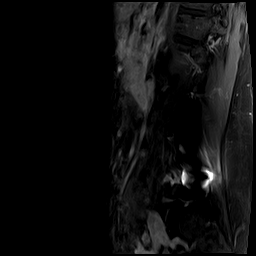

[Series 12: T1 fat-sat post-contrast · oblique · 4.0mm · 0.70mm/px · 4 of 23 slices shown (2 of 2)]
[im 1/23]
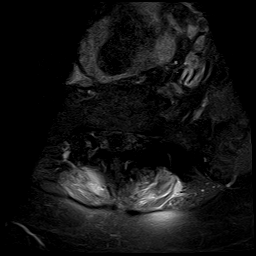
[im 8/23]
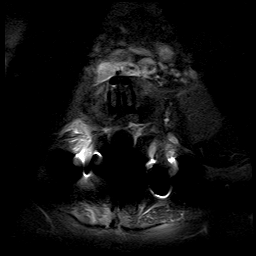
[im 15/23]
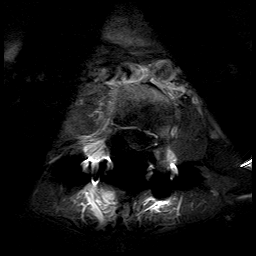
[im 23/23]
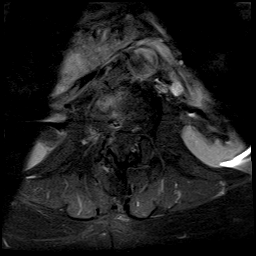

[48 of 48 positions shown; findings below may reference images not displayed]

FINDINGS: Scoliosis of lumbar spine with curvature convex to the left in the mid lumbar spine measuring 29°.

Lower spinal cord and cauda equina are normal. 

At L1-2 level, severe degenerative disc disease and facet arthropathy with asymmetric bulging annulus to the right is causing significant right foraminal stenosis.  Moderate compromise of thecal sac with AP diameter in the midline measuring 11 mm.  Minimal retrolisthesis of L1 on L2 with Modic inflammatory changes on both sides.

At L2-3 level, degenerative disc disease and facet arthropathy are noted causing significant right foraminal stenosis. Mild left foraminal stenosis is noted.

Postsurgical changes with dorsal laminectomy at L3-4, L4-5 levels are noted with posterior fixation hardware.  Artifacts from the hardware limit visualization.

At L5-S1 level, prominent degenerative disc changes with osteophyte complex and facet arthropathy are causing significant left foraminal stenosis.

Paravertebral soft tissues are unremarkable.
IMPRESSION: 1.  Scoliosis of lumbar spine with curvature convex to the left. 

2.  Postsurgical changes with dorsal laminectomy at L3-4, L4-5 levels are noted with posterior fixation hardware.  Artifacts from the hardware limit visualization.

3.  Degenerative changes as described above in detail at each level. 

4.  No abnormal enhancement on postcontrast study.  

Electronically Signed by MELINDAA, PERENYI at 07-Eov-FMFZ [DATE]

## 2022-11-14 ENCOUNTER — Encounter (INDEPENDENT_AMBULATORY_CARE_PROVIDER_SITE_OTHER): Payer: Self-pay

## 2022-11-14 ENCOUNTER — Ambulatory Visit (INDEPENDENT_AMBULATORY_CARE_PROVIDER_SITE_OTHER): Payer: Self-pay | Admitting: OBSTETRICS/GYNECOLOGY

## 2022-12-24 ENCOUNTER — Other Ambulatory Visit (INDEPENDENT_AMBULATORY_CARE_PROVIDER_SITE_OTHER): Payer: Self-pay | Admitting: Nephrology

## 2022-12-24 DIAGNOSIS — N183 Chronic kidney disease, stage 3 unspecified (CMS HCC): Secondary | ICD-10-CM

## 2022-12-24 DIAGNOSIS — E559 Vitamin D deficiency, unspecified: Secondary | ICD-10-CM

## 2022-12-25 ENCOUNTER — Other Ambulatory Visit: Payer: Self-pay

## 2022-12-25 ENCOUNTER — Other Ambulatory Visit: Payer: Medicare Other | Attending: Nephrology

## 2022-12-25 DIAGNOSIS — N183 Chronic kidney disease, stage 3 unspecified: Secondary | ICD-10-CM | POA: Insufficient documentation

## 2022-12-25 DIAGNOSIS — E559 Vitamin D deficiency, unspecified: Secondary | ICD-10-CM

## 2022-12-25 LAB — MICROALBUMIN/CREATININE RATIO, URINE, RANDOM
CREATININE RANDOM URINE: 222 mg/dL — ABNORMAL HIGH (ref 11–26)
MICROALBUMIN RANDOM URINE: 2.2 mg/dL
MICROALBUMIN/CREATININE RATIO RANDOM URINE: 9.9 mg/g

## 2022-12-25 LAB — CBC WITH DIFF
BASOPHIL #: 0.1 10*3/uL (ref 0.00–0.10)
BASOPHIL %: 1 % (ref 0–1)
EOSINOPHIL #: 0.1 10*3/uL (ref 0.00–0.50)
EOSINOPHIL %: 2 %
HCT: 41.5 % (ref 31.2–41.9)
HGB: 13.9 g/dL (ref 10.9–14.3)
LYMPHOCYTE #: 2.3 10*3/uL (ref 1.00–3.00)
LYMPHOCYTE %: 33 % (ref 16–44)
MCH: 31.6 pg (ref 24.7–32.8)
MCHC: 33.5 g/dL (ref 32.3–35.6)
MCV: 94.4 fL (ref 75.5–95.3)
MONOCYTE #: 0.6 10*3/uL (ref 0.30–1.00)
MONOCYTE %: 9 % (ref 5–13)
MPV: 7.9 fL (ref 7.9–10.8)
NEUTROPHIL #: 3.7 10*3/uL (ref 1.85–7.80)
NEUTROPHIL %: 55 % (ref 43–77)
PLATELETS: 225 10*3/uL (ref 140–440)
RBC: 4.39 10*6/uL (ref 3.63–4.92)
RDW: 13.3 % (ref 12.3–17.7)
WBC: 6.8 10*3/uL (ref 3.8–11.8)

## 2022-12-25 LAB — URIC ACID: URIC ACID: 5.7 mg/dL (ref 2.3–7.6)

## 2022-12-25 LAB — BASIC METABOLIC PANEL
ANION GAP: 8 mmol/L (ref 4–13)
BUN/CREA RATIO: 20 (ref 6–22)
BUN: 25 mg/dL (ref 7–25)
CALCIUM: 9.7 mg/dL (ref 8.6–10.3)
CHLORIDE: 112 mmol/L — ABNORMAL HIGH (ref 98–107)
CO2 TOTAL: 23 mmol/L (ref 21–31)
CREATININE: 1.25 mg/dL (ref 0.60–1.30)
ESTIMATED GFR: 45 mL/min/{1.73_m2} — ABNORMAL LOW (ref >59)
GLUCOSE: 93 mg/dL (ref 74–109)
OSMOLALITY, CALCULATED: 289 mOsm/kg (ref 270–290)
POTASSIUM: 4 mmol/L (ref 3.5–5.1)
SODIUM: 143 mmol/L (ref 136–145)

## 2022-12-25 LAB — PROTEIN/CREATININE RATIO, URINE, RANDOM
CREATININE RANDOM URINE: 222 mg/dL — ABNORMAL HIGH (ref 11–26)
PROTEIN RANDOM URINE: 20 mg/dL — ABNORMAL LOW (ref 50–80)
PROTEIN/CREATININE RATIO RANDOM URINE: 0.09 mg/mg (ref 0.000–200.000)

## 2022-12-25 LAB — VITAMIN D 25 TOTAL: VITAMIN D: 52 ng/mL (ref 30–100)

## 2022-12-25 LAB — MAGNESIUM: MAGNESIUM: 2.1 mg/dL (ref 1.9–2.7)

## 2022-12-27 LAB — PARATHYROID HORMONE (PTH): PTH: 93.5 pg/mL — ABNORMAL HIGH (ref 12.0–88.0)

## 2023-01-01 ENCOUNTER — Other Ambulatory Visit: Payer: Self-pay

## 2023-01-01 ENCOUNTER — Ambulatory Visit (INDEPENDENT_AMBULATORY_CARE_PROVIDER_SITE_OTHER): Payer: Medicare Other | Admitting: Nephrology

## 2023-01-01 ENCOUNTER — Encounter (INDEPENDENT_AMBULATORY_CARE_PROVIDER_SITE_OTHER): Payer: Self-pay | Admitting: Nephrology

## 2023-01-01 VITALS — BP 129/62 | HR 85 | Ht 67.0 in | Wt 198.8 lb

## 2023-01-01 DIAGNOSIS — E559 Vitamin D deficiency, unspecified: Secondary | ICD-10-CM

## 2023-01-01 DIAGNOSIS — N2581 Secondary hyperparathyroidism of renal origin: Secondary | ICD-10-CM

## 2023-01-01 DIAGNOSIS — N183 Chronic kidney disease, stage 3 unspecified (CMS HCC): Secondary | ICD-10-CM

## 2023-01-01 DIAGNOSIS — I129 Hypertensive chronic kidney disease with stage 1 through stage 4 chronic kidney disease, or unspecified chronic kidney disease: Secondary | ICD-10-CM

## 2023-01-01 NOTE — Progress Notes (Signed)
[facility]    Progress Note    Name: Donna Roberts MRN:  [id number]   Date: 01/01/2023 Age: 77 y.o.          Nephrology Follow Up Visit          HPI: 77 y.o. Pleasant female with past medical history of CKD stage IIIA, hypertension, anxiety here for follow-up on chronic kidney disease stage IIIA.   Patient had varicose vein procedure today and she is feeling pain at the injection site.  Patient denies nausea or vomiting.  No diarrhea.  Reports chronic edema in the left lower extremity due to blood clots in the past.  No dysuria.    Discussed with the patient the findings her lab work all questions were answered.      ROS:         Systematic review of 12 organ systems was negative except what mentioned in in the HPI.      OBJECTIVE:   BP 129/62   Pulse 85   Ht 1.702 m (5\' 7" )   Wt 90.2 kg (198 lb 12.8 oz)   BMI 31.14 kg/m       General:   AAOx3.  Anxious  HEENT:  EOMI, MMM, no pallor, no icterus  NECK: No increased JVD.    HEART: Normal S1 and S2. Regular rhythm. No murmurs or rubs.   LUNGS: Clear to auscultation bilateral. No wheezes, rales, or rhonchi.   ABDOMEN: +BS, Soft, nontender and nondistended. No rebound or guarding present.   EXTREMITIES: No edema. No asterixis    NEURO : moving all extremities. emoi      LABORATORY DATA:   Lab Results   Component Value Date    BUN 25 12/25/2022    BUN 27 (H) 08/15/2022    BUN 28 (H) 03/12/2022    CREATININE 1.25 12/25/2022    CREATININE 1.28 08/15/2022    CREATININE 1.20 03/12/2022    BUNCRRATIO 20 12/25/2022    BUNCRRATIO 21 08/15/2022    BUNCRRATIO 23 (H) 03/12/2022    GFR 45 (L) 12/25/2022    GFR 44 (L) 08/15/2022    GFR 47 (L) 03/12/2022    SODIUM 143 12/25/2022    SODIUM 141 08/15/2022    SODIUM 142 03/12/2022    POTASSIUM 4.0 12/25/2022    POTASSIUM 4.2 08/15/2022    POTASSIUM 4.2 03/12/2022    CHLORIDE 112 (H) 12/25/2022    CHLORIDE 110 (H) 08/15/2022    CHLORIDE 110 (H) 03/12/2022    CO2 23 12/25/2022    CO2 26  08/15/2022    CO2 25 03/12/2022    ANIONGAP 8 12/25/2022    ANIONGAP 5 08/15/2022    ANIONGAP 7 (L) 03/12/2022    CALCIUM 9.7 12/25/2022    CALCIUM 9.6 08/15/2022    CALCIUM 9.9 03/12/2022    HGB 13.9 12/25/2022    HGB 14.1 08/15/2022    HGB 14.5 03/12/2022    HCT 41.5 12/25/2022    HCT 40.2 08/15/2022    HCT 40.6 03/12/2022    INTACTPTH 93.5 (H) 12/25/2022    INTACTPTH 61.1 08/15/2022    INTACTPTH 76.3 03/12/2022       Lab Results   Component Value Date    URICACID 5.7 12/25/2022            MEDICATIONS:  Outpatient Medications Marked as Taking for the 01/01/23 encounter (Office Visit) with Beather Arbour, MD   Medication Sig    atorvastatin (LIPITOR)  80 mg Oral Tablet atorvastatin 80 mg tablet    lamoTRIgine (LAMICTAL) 25 mg Oral Tablet lamotrigine 25 mg tablet    lurasidone (LATUDA) 20 mg Oral Tablet Take 1 Tablet (20 mg total) by mouth Once a day    rivaroxaban (XARELTO) 20 mg Oral Tablet Take 1 Tablet (20 mg total) by mouth    sertraline (ZOLOFT) 100 mg Oral Tablet 07/26/2014 Sertraline Oral  PO 1.0 TABLET(S) daily  07/26/2014  active     Current Outpatient Medications   Medication Instructions    atorvastatin (LIPITOR) 80 mg Oral Tablet atorvastatin 80 mg tablet    hydrOXYzine HCL (ATARAX) 45 mg, Oral, DAILY PRN    lamoTRIgine (LAMICTAL) 25 mg Oral Tablet lamotrigine 25 mg tablet    lurasidone (LATUDA) 20 mg, Oral, DAILY    rivaroxaban (XARELTO) 20 mg, Oral    sertraline (ZOLOFT) 100 mg Oral Tablet 07/26/2014 Sertraline Oral  PO 1.0 TABLET(S) daily  07/26/2014  active         ASSESSMENT / PLAN:   ENCOUNTER DIAGNOSES     ICD-10-CM   1. CKD (chronic kidney disease) stage 3, GFR 30-59 ml/min (CMS HCC)  N18.30   2. Vitamin D deficiency  E55.9                 Chronic kidney disease  -Stage IIIA  -Due to age  -Creatinine is stable 1.25.  -Baseline creatinine 1.1  -Total protein to creatinine ratio not significant  -CBC and a basic metabolic panel  -Renal ultrasound ordered and not performed may need to order the test  again next year.  Condition is stable  -Return to clinic in 5-6 months  -Continue low-sodium diet  -Avoid NSAIDs     CKD bone mineral disease/stable  -PTH elevated secondary hyperparathyroidism we will monitor as vitamin-D levels are normal if continues to go up we will add calcitriol.  -Vitamin D level at goal        Hypertension  -Blood pressure is at goal  -Goal less than 130/80  -She is not on lisinopril/ARBs  -Low-salt diet     Orders Placed This Encounter    BASIC METABOLIC PANEL    CBC/DIFF    MAGNESIUM    MICROALBUMIN/CREATININE RATIO, URINE, RANDOM    PARATHYROID HORMONE (PTH)    PROTEIN/CREATININE RATIO, URINE, RANDOM    URIC ACID    VITAMIN D 25 TOTAL

## 2023-04-25 ENCOUNTER — Other Ambulatory Visit (INDEPENDENT_AMBULATORY_CARE_PROVIDER_SITE_OTHER): Payer: Self-pay

## 2023-04-26 ENCOUNTER — Ambulatory Visit (INDEPENDENT_AMBULATORY_CARE_PROVIDER_SITE_OTHER): Payer: Medicare Other

## 2023-04-26 ENCOUNTER — Other Ambulatory Visit: Payer: Self-pay

## 2023-04-26 ENCOUNTER — Other Ambulatory Visit: Payer: Medicare Other | Attending: Nephrology | Admitting: Nephrology

## 2023-04-26 DIAGNOSIS — N183 Chronic kidney disease, stage 3 unspecified (CMS HCC): Secondary | ICD-10-CM

## 2023-04-26 DIAGNOSIS — E559 Vitamin D deficiency, unspecified: Secondary | ICD-10-CM | POA: Insufficient documentation

## 2023-04-26 LAB — VITAMIN D 25 TOTAL: VITAMIN D: 33 ng/mL (ref 30–100)

## 2023-04-26 LAB — CBC WITH DIFF
BASOPHIL #: 0.1 10*3/uL (ref 0.00–0.10)
BASOPHIL %: 1 % (ref 0–1)
EOSINOPHIL #: 0.1 10*3/uL (ref 0.00–0.50)
EOSINOPHIL %: 2 %
HCT: 41.8 % (ref 31.2–41.9)
HGB: 14.3 g/dL (ref 10.9–14.3)
LYMPHOCYTE #: 2.3 10*3/uL (ref 1.00–3.00)
LYMPHOCYTE %: 33 % (ref 16–44)
MCH: 32 pg (ref 24.7–32.8)
MCHC: 34.1 g/dL (ref 32.3–35.6)
MCV: 93.9 fL (ref 75.5–95.3)
MONOCYTE #: 0.5 10*3/uL (ref 0.30–1.00)
MONOCYTE %: 7 % (ref 5–13)
MPV: 8.7 fL (ref 7.9–10.8)
NEUTROPHIL #: 3.9 10*3/uL (ref 1.85–7.80)
NEUTROPHIL %: 57 % (ref 43–77)
PLATELETS: 237 10*3/uL (ref 140–440)
RBC: 4.45 10*6/uL (ref 3.63–4.92)
RDW: 13.8 % (ref 12.3–17.7)
WBC: 6.8 10*3/uL (ref 3.8–11.8)

## 2023-04-26 LAB — BASIC METABOLIC PANEL
ANION GAP: 11 mmol/L (ref 4–13)
BUN/CREA RATIO: 23 — ABNORMAL HIGH (ref 6–22)
BUN: 29 mg/dL — ABNORMAL HIGH (ref 7–25)
CALCIUM: 10.1 mg/dL (ref 8.6–10.3)
CHLORIDE: 110 mmol/L — ABNORMAL HIGH (ref 98–107)
CO2 TOTAL: 24 mmol/L (ref 21–31)
CREATININE: 1.25 mg/dL (ref 0.60–1.30)
ESTIMATED GFR: 45 mL/min/{1.73_m2} — ABNORMAL LOW (ref >59)
GLUCOSE: 86 mg/dL (ref 74–109)
OSMOLALITY, CALCULATED: 294 mOsm/kg — ABNORMAL HIGH (ref 270–290)
POTASSIUM: 4.3 mmol/L (ref 3.5–5.1)
SODIUM: 145 mmol/L (ref 136–145)

## 2023-04-26 LAB — PARATHYROID HORMONE (PTH): PTH: 70.1 pg/mL (ref 12.0–88.0)

## 2023-04-26 LAB — PROTEIN/CREATININE RATIO, URINE, RANDOM
CREATININE RANDOM URINE: 146 mg/dL — ABNORMAL HIGH (ref 30–125)
PROTEIN RANDOM URINE: 10 mg/dL — ABNORMAL LOW (ref 50–80)
PROTEIN/CREATININE RATIO RANDOM URINE: 0.068 mg/mg (ref 0.000–200.000)

## 2023-04-26 LAB — MICROALBUMIN/CREATININE RATIO, URINE, RANDOM
CREATININE RANDOM URINE: 146 mg/dL — ABNORMAL HIGH (ref 30–125)
MICROALBUMIN RANDOM URINE: 2 mg/dL
MICROALBUMIN/CREATININE RATIO RANDOM URINE: 13.7 mg/g

## 2023-04-26 LAB — MAGNESIUM: MAGNESIUM: 2.1 mg/dL (ref 1.9–2.7)

## 2023-04-26 LAB — URIC ACID: URIC ACID: 6.2 mg/dL (ref 2.3–7.6)

## 2023-05-02 ENCOUNTER — Encounter (INDEPENDENT_AMBULATORY_CARE_PROVIDER_SITE_OTHER): Payer: Self-pay

## 2023-05-02 ENCOUNTER — Ambulatory Visit (INDEPENDENT_AMBULATORY_CARE_PROVIDER_SITE_OTHER): Payer: Medicare Other

## 2023-05-02 ENCOUNTER — Other Ambulatory Visit: Payer: Self-pay

## 2023-05-02 VITALS — BP 127/68 | HR 62 | Ht 67.0 in | Wt 187.4 lb

## 2023-05-02 DIAGNOSIS — N183 Chronic kidney disease, stage 3 unspecified (CMS HCC): Secondary | ICD-10-CM

## 2023-05-02 DIAGNOSIS — I129 Hypertensive chronic kidney disease with stage 1 through stage 4 chronic kidney disease, or unspecified chronic kidney disease: Secondary | ICD-10-CM

## 2023-05-02 DIAGNOSIS — R7303 Prediabetes: Secondary | ICD-10-CM

## 2023-05-02 DIAGNOSIS — N1831 Chronic kidney disease, stage 3a (CMS HCC): Secondary | ICD-10-CM

## 2023-05-02 DIAGNOSIS — E559 Vitamin D deficiency, unspecified: Secondary | ICD-10-CM

## 2023-05-02 NOTE — Progress Notes (Signed)
NEPHROLOGY, [facility]  [address] [place] [address]    Progress Note    Name: Donna Roberts MRN:  [id number]   Date: 05/02/2023 DOB:  May 23, 1946 (76 y.o.)              Nephrology Out  Patient Visit       HPI : 77 y.o.  female presents to the clinic lab follow-up for chronic kidney disease stage IIIA GFR 45 creatinine 1.25.  Past medical history of hypertension and prediabetic.  Denies nausea vomiting no diarrhea no lower extremities edema.  Denies current NSAID use.  No hematuria or dysuria.       ROS:     Assessment was negative except what mentioned in in the HPI.     OBJECTIVE:   BP 127/68   Pulse 62   Ht 1.702 m (5\' 7" )   Wt 85 kg (187 lb 6.3 oz)   BMI 29.35 kg/m       General:  NAD, AAOx3  HEENT:  EOMI,  no icterus  NECK: No increased JVD.  Normal inspection   HEART: Normal S1 and S2. Regular rhythm. No murmurs or rubs. No chest wall tenderness   LUNGS: Clear to auscultation bilateral. No wheezes, rales, or rhonchi.   ABDOMEN: +BS, Soft, nontender and nondistended. No rebound or guarding present.   EXTREMITIES: No edema. No cyanosis or clubbing.No asterixis    NEURO : Normal speech, EOMI.       LABORATORY DATA:   Lab Results   Component Value Date    BUN 29 (H) 04/26/2023    BUN 25 12/25/2022    BUN 27 (H) 08/15/2022    CREATININE 1.25 04/26/2023    CREATININE 1.25 12/25/2022    CREATININE 1.28 08/15/2022    BUNCRRATIO 23 (H) 04/26/2023    BUNCRRATIO 20 12/25/2022    BUNCRRATIO 21 08/15/2022    GFR 45 (L) 04/26/2023    GFR 45 (L) 12/25/2022    GFR 44 (L) 08/15/2022    SODIUM 145 04/26/2023    SODIUM 143 12/25/2022    SODIUM 141 08/15/2022    POTASSIUM 4.3 04/26/2023    POTASSIUM 4.0 12/25/2022    POTASSIUM 4.2 08/15/2022    CHLORIDE 110 (H) 04/26/2023    CHLORIDE 112 (H) 12/25/2022    CHLORIDE 110 (H) 08/15/2022    CO2 24 04/26/2023    CO2 23 12/25/2022    CO2 26 08/15/2022    ANIONGAP 11 04/26/2023    ANIONGAP 8 12/25/2022    ANIONGAP 5 08/15/2022    CALCIUM 10.1 04/26/2023     CALCIUM 9.7 12/25/2022    CALCIUM 9.6 08/15/2022    HGB 14.3 04/26/2023    HGB 13.9 12/25/2022    HGB 14.1 08/15/2022    HCT 41.8 04/26/2023    HCT 41.5 12/25/2022    HCT 40.2 08/15/2022    INTACTPTH 70.1 04/26/2023    INTACTPTH 93.5 (H) 12/25/2022    INTACTPTH 61.1 08/15/2022           MEDICATIONS:  Outpatient Medications Marked as Taking for the 05/02/23 encounter (Office Visit) with Cydney Ok, NP   Medication Sig    ARIPiprazole (ABILIFY) 5 mg Oral Tablet aripiprazole 5 mg tablet    atorvastatin (LIPITOR) 80 mg Oral Tablet atorvastatin 80 mg tablet    hydrOXYzine HCL (ATARAX) 10 mg Oral Tablet Take 4.5 Tablets (45 mg total) by mouth Once per day as needed    lamoTRIgine (LAMICTAL) 25 mg  Oral Tablet lamotrigine 25 mg tablet    rivaroxaban (XARELTO) 20 mg Oral Tablet Take 1 Tablet (20 mg total) by mouth    sertraline (ZOLOFT) 100 mg Oral Tablet 07/26/2014 Sertraline Oral  PO 1.0 TABLET(S) daily  07/26/2014  active     Current Outpatient Medications   Medication Instructions    ARIPiprazole (ABILIFY) 5 mg Oral Tablet aripiprazole 5 mg tablet    atorvastatin (LIPITOR) 80 mg Oral Tablet atorvastatin 80 mg tablet    hydrOXYzine HCL (ATARAX) 45 mg, Oral, DAILY PRN    lamoTRIgine (LAMICTAL) 25 mg Oral Tablet lamotrigine 25 mg tablet    rivaroxaban (XARELTO) 20 mg, Oral    sertraline (ZOLOFT) 100 mg Oral Tablet 07/26/2014 Sertraline Oral  PO 1.0 TABLET(S) daily  07/26/2014  active       ASSESSMENT / PLAN:   ENCOUNTER DIAGNOSES     ICD-10-CM   1. CKD (chronic kidney disease) stage 3, GFR 30-59 ml/min (CMS HCC)  N18.30   2. Vitamin D deficiency  E55.9   3. Prediabetes  R73.03          Chronic kidney disease  -Stage IIIa  -Due to   -Creatinine is 1.25/GFR 45  -Baseline creatinine: 1.1  - Total protein to creatinine ratio 0.07  -monitor CBC and a basic metabolic panel  -Return to clinic in 3 months  -Continue low-sodium diet  -balanced Fluid intake 40-50 oz a day.    -Avoid NSAIDs.  Tylenol only for pain  -Renal  imaging: not performed will order next appointment per request of patient  -ACEI or ARBS:  no  -Sodium Glucose Cotransporter-2 (SGLT2) Inhibitors:no      CKD bone mineral disease  -PTH at goal  -Vitamin D level   -Phosphorus level.         Hypertension  -Blood pressure is at goal   -Goal less than 130/80  -not on ACE/ARB  -Low-salt diet      Prediabetes  -A1c goal is less than 7 No results found for: "HA1C"   -Continue current medications  -Diabetic diet  -Increase activity and exercise as possible  -Monitor A1C        I have discussed with the patient the following issues:  The main goal of treatment is to prevent progression of CKD to complete kidney failure. The best way to do this is to control the underlying cause.  the optimal diet is one similar to the Dietary Approaches to Stop Hypertension (DASH) diet, consisting of fruits, vegetables, legumes, fish, poultry, and whole grains.  Restrict sodium intake to less than 2 g/day        Orders Placed This Encounter    BASIC METABOLIC PANEL    CBC/DIFF    MAGNESIUM    MICROALBUMIN/CREATININE RATIO, URINE, RANDOM    PARATHYROID HORMONE (PTH)    PROTEIN/CREATININE RATIO, URINE, RANDOM    URIC ACID    VITAMIN D 25 TOTAL         Cydney Ok, NP

## 2023-07-24 ENCOUNTER — Other Ambulatory Visit: Payer: Self-pay

## 2023-07-24 ENCOUNTER — Other Ambulatory Visit: Payer: Medicare Other

## 2023-07-24 DIAGNOSIS — N183 Chronic kidney disease, stage 3 unspecified (CMS HCC): Secondary | ICD-10-CM

## 2023-07-24 DIAGNOSIS — E559 Vitamin D deficiency, unspecified: Secondary | ICD-10-CM

## 2023-07-24 LAB — CBC WITH DIFF
BASOPHIL #: 0.1 10*3/uL (ref 0.00–0.10)
BASOPHIL %: 1 % (ref 0–1)
EOSINOPHIL #: 0.1 10*3/uL (ref 0.00–0.50)
EOSINOPHIL %: 3 % (ref 1–7)
HCT: 42.3 % — ABNORMAL HIGH (ref 31.2–41.9)
HGB: 14.3 g/dL (ref 10.9–14.3)
LYMPHOCYTE #: 1.8 10*3/uL (ref 1.00–3.00)
LYMPHOCYTE %: 33 % (ref 16–44)
MCH: 31.9 pg (ref 24.7–32.8)
MCHC: 33.9 g/dL (ref 32.3–35.6)
MCV: 94.1 fL (ref 75.5–95.3)
MONOCYTE #: 0.4 10*3/uL (ref 0.30–1.00)
MONOCYTE %: 7 % (ref 5–13)
MPV: 8 fL (ref 7.9–10.8)
NEUTROPHIL #: 3.1 10*3/uL (ref 1.85–7.80)
NEUTROPHIL %: 57 % (ref 43–77)
PLATELETS: 228 10*3/uL (ref 140–440)
RBC: 4.5 10*6/uL (ref 3.63–4.92)
RDW: 13.6 % (ref 12.3–17.7)
WBC: 5.6 10*3/uL (ref 3.8–11.8)

## 2023-07-24 LAB — PARATHYROID HORMONE (PTH): PTH: 79.7 pg/mL (ref 12.0–88.0)

## 2023-07-24 LAB — BASIC METABOLIC PANEL
ANION GAP: 7 mmol/L (ref 4–13)
BUN/CREA RATIO: 16 (ref 6–22)
BUN: 21 mg/dL (ref 7–25)
CALCIUM: 9.4 mg/dL (ref 8.6–10.3)
CHLORIDE: 112 mmol/L — ABNORMAL HIGH (ref 98–107)
CO2 TOTAL: 23 mmol/L (ref 21–31)
CREATININE: 1.33 mg/dL — ABNORMAL HIGH (ref 0.60–1.30)
ESTIMATED GFR: 41 mL/min/{1.73_m2} — ABNORMAL LOW (ref >59)
GLUCOSE: 86 mg/dL (ref 74–109)
OSMOLALITY, CALCULATED: 285 mOsm/kg (ref 270–290)
POTASSIUM: 4.2 mmol/L (ref 3.5–5.1)
SODIUM: 142 mmol/L (ref 136–145)

## 2023-07-24 LAB — URIC ACID: URIC ACID: 7.2 mg/dL (ref 2.3–7.6)

## 2023-07-24 LAB — PROTEIN/CREATININE RATIO, URINE, RANDOM
CREATININE RANDOM URINE: 189 mg/dL — ABNORMAL HIGH (ref 30–125)
PROTEIN RANDOM URINE: 15 mg/dL — ABNORMAL LOW (ref 50–80)
PROTEIN/CREATININE RATIO RANDOM URINE: 0.079 mg/mg (ref 0.000–200.000)

## 2023-07-24 LAB — MAGNESIUM: MAGNESIUM: 2.2 mg/dL (ref 1.9–2.7)

## 2023-07-24 LAB — VITAMIN D 25 TOTAL: VITAMIN D 25, TOTAL: 29.25 ng/mL — ABNORMAL LOW (ref 30.00–100.00)

## 2023-07-24 LAB — MICROALBUMIN/CREATININE RATIO, URINE, RANDOM
CREATININE RANDOM URINE: 189 mg/dL — ABNORMAL HIGH (ref 30–125)
MICROALBUMIN RANDOM URINE: 2.4 mg/dL
MICROALBUMIN/CREATININE RATIO RANDOM URINE: 12.7 mg/g

## 2023-07-26 ENCOUNTER — Other Ambulatory Visit (INDEPENDENT_AMBULATORY_CARE_PROVIDER_SITE_OTHER): Payer: Self-pay

## 2023-08-02 ENCOUNTER — Other Ambulatory Visit: Payer: Self-pay

## 2023-08-02 ENCOUNTER — Ambulatory Visit (INDEPENDENT_AMBULATORY_CARE_PROVIDER_SITE_OTHER): Payer: Medicare Other

## 2023-08-02 ENCOUNTER — Encounter (INDEPENDENT_AMBULATORY_CARE_PROVIDER_SITE_OTHER): Payer: Self-pay

## 2023-08-02 VITALS — BP 143/72 | HR 72 | Ht 67.0 in | Wt 191.0 lb

## 2023-08-02 DIAGNOSIS — N1832 Chronic kidney disease, stage 3b (CMS HCC): Secondary | ICD-10-CM

## 2023-08-02 DIAGNOSIS — E559 Vitamin D deficiency, unspecified: Secondary | ICD-10-CM

## 2023-08-02 DIAGNOSIS — I129 Hypertensive chronic kidney disease with stage 1 through stage 4 chronic kidney disease, or unspecified chronic kidney disease: Secondary | ICD-10-CM

## 2023-08-02 DIAGNOSIS — N183 Chronic kidney disease, stage 3 unspecified (CMS HCC): Secondary | ICD-10-CM

## 2023-08-02 MED ORDER — ERGOCALCIFEROL (VITAMIN D2) 1,250 MCG (50,000 UNIT) CAPSULE
50000.0000 [IU] | ORAL_CAPSULE | ORAL | 0 refills | Status: DC
Start: 2023-08-02 — End: 2024-02-03

## 2023-08-02 NOTE — Progress Notes (Unsigned)
NE[facility]OLOGY, [facility]  [address] [place] [address]    Progress Note    Name: Donna Roberts MRN:  [id number]   Date: 08/02/2023 DOB:  November 20, 1946 (76 y.o.)              Nephrology Out  Patient Visit         HPI : 77 y.o.   female presents to the clinic lab follow-up for chronic kidney disease stage IIIA GFR 41 creatinine 1.33.   Past medical history of hypertension and prediabetic.  Denies nausea vomiting no diarrhea no lower extremities edema.  Denies hematuria and dysuria.  Denies current NSAID use.  States blood pressure has been under good control.        ROS:      Assessment was negative except what mentioned in in the HPI.     OBJECTIVE:   BP (!) 143/72   Pulse 72   Ht 1.702 m (5\' 7" )   Wt 86.6 kg (191 lb)   BMI 29.91 kg/m       General:  NAD, AAOx3  HEENT:  EOMI,  no icterus  NECK: No increased JVD.  Normal inspection   HEART:  Regular rhythm. No murmurs or rubs. No chest wall tenderness   LUNGS: Clear to auscultation bilateral. No wheezes, rales, or rhonchi.   ABDOMEN: +BS, Soft, nontender and nondistended. No rebound or guarding present.   EXTREMITIES: No edema. No cyanosis or clubbing.No asterixis    NEURO : Normal speech, EOMI.       LABORATORY DATA:   Lab Results   Component Value Date    BUN 21 07/24/2023    BUN 29 (H) 04/26/2023    BUN 25 12/25/2022    CREATININE 1.33 (H) 07/24/2023    CREATININE 1.25 04/26/2023    CREATININE 1.25 12/25/2022    BUNCRRATIO 16 07/24/2023    BUNCRRATIO 23 (H) 04/26/2023    BUNCRRATIO 20 12/25/2022    GFR 41 (L) 07/24/2023    GFR 45 (L) 04/26/2023    GFR 45 (L) 12/25/2022    SODIUM 142 07/24/2023    SODIUM 145 04/26/2023    SODIUM 143 12/25/2022    POTASSIUM 4.2 07/24/2023    POTASSIUM 4.3 04/26/2023    POTASSIUM 4.0 12/25/2022    CHLORIDE 112 (H) 07/24/2023    CHLORIDE 110 (H) 04/26/2023    CHLORIDE 112 (H) 12/25/2022    CO2 23 07/24/2023    CO2 24 04/26/2023    CO2 23 12/25/2022    ANIONGAP 7 07/24/2023    ANIONGAP 11 04/26/2023    ANIONGAP 8  12/25/2022    CALCIUM 9.4 07/24/2023    CALCIUM 10.1 04/26/2023    CALCIUM 9.7 12/25/2022    HGB 14.3 07/24/2023    HGB 14.3 04/26/2023    HGB 13.9 12/25/2022    HCT 42.3 (H) 07/24/2023    HCT 41.8 04/26/2023    HCT 41.5 12/25/2022    INTACTPTH 79.7 07/24/2023    INTACTPTH 70.1 04/26/2023    INTACTPTH 93.5 (H) 12/25/2022           MEDICATIONS:  Outpatient Medications Marked as Taking for the 08/02/23 encounter (Office Visit) with Cydney Ok, NP   Medication Sig    ARIPiprazole (ABILIFY) 5 mg Oral Tablet aripiprazole 5 mg tablet    atorvastatin (LIPITOR) 80 mg Oral Tablet atorvastatin 80 mg tablet    hydrOXYzine HCL (ATARAX) 10 mg Oral Tablet Take 4.5 Tablets (45 mg total) by mouth  Once per day as needed    lamoTRIgine (LAMICTAL) 25 mg Oral Tablet lamotrigine 25 mg tablet    rivaroxaban (XARELTO) 20 mg Oral Tablet Take 1 Tablet (20 mg total) by mouth    sertraline (ZOLOFT) 100 mg Oral Tablet 07/26/2014 Sertraline Oral  PO 1.0 TABLET(S) daily  07/26/2014  active     Current Outpatient Medications   Medication Instructions    ARIPiprazole (ABILIFY) 5 mg Oral Tablet aripiprazole 5 mg tablet    atorvastatin (LIPITOR) 80 mg Oral Tablet atorvastatin 80 mg tablet    hydrOXYzine HCL (ATARAX) 45 mg, Oral, DAILY PRN    lamoTRIgine (LAMICTAL) 25 mg Oral Tablet lamotrigine 25 mg tablet    rivaroxaban (XARELTO) 20 mg, Oral    sertraline (ZOLOFT) 100 mg Oral Tablet 07/26/2014 Sertraline Oral  PO 1.0 TABLET(S) daily  07/26/2014  active       ASSESSMENT / PLAN:   ENCOUNTER DIAGNOSES     ICD-10-CM   1. CKD (chronic kidney disease) stage 3, GFR 30-59 ml/min (CMS HCC)  N18.30   2. Vitamin D deficiency  E55.9            Chronic kidney disease  -Stage IIIb  -Due to Hypertension   -Creatinine :  1.33/GFR 41   -Baseline creatinine 1.1  -Total protein to creatinine ratio:  0.08  -CBC and a basic metabolic panel  -Return to clinic in 3 months  -Continue low-sodium diet  -balanced Fluid intake 45 oz a day.    -Avoid NSAIDs.  Tylenol  only for pain  -Renal imaging: enal ultrasound ordered and not performed may need to order the test again next year. Condition is stable  -Sodium Glucose Cotransporter-2 (SGLT2) Inhibitors: no         Vitamin-D deficiency  -monitor vitamin-D level  -continue vitamin-D supplement        I have discussed with the patient the following issues:  The main goal of treatment is to prevent progression of CKD to complete kidney failure. The best way to do this is to control the underlying cause.  the optimal diet is one similar to the Dietary Approaches to Stop Hypertension (DASH) diet, consisting of fruits, vegetables, legumes, fish, poultry, and whole grains.  Restrict sodium intake to less than 2 g/day      Orders Placed This Encounter    BASIC METABOLIC PANEL    CBC/DIFF    MAGNESIUM    MICROALBUMIN/CREATININE RATIO, URINE, RANDOM    PARATHYROID HORMONE (PTH)    PROTEIN/CREATININE RATIO, URINE, RANDOM    URIC ACID    VITAMIN D 25 TOTAL    ergocalciferol, vitamin D2, (DRISDOL) 1,250 mcg (50,000 unit) Oral Capsule       Cydney Ok, NP

## 2023-09-25 ENCOUNTER — Other Ambulatory Visit (INDEPENDENT_AMBULATORY_CARE_PROVIDER_SITE_OTHER): Payer: Self-pay

## 2023-09-26 NOTE — Telephone Encounter (Signed)
Done with Treatment

## 2024-01-23 ENCOUNTER — Other Ambulatory Visit (INDEPENDENT_AMBULATORY_CARE_PROVIDER_SITE_OTHER): Payer: Self-pay

## 2024-01-23 ENCOUNTER — Other Ambulatory Visit: Payer: Self-pay

## 2024-01-23 ENCOUNTER — Ambulatory Visit: Payer: Medicare Other

## 2024-01-23 DIAGNOSIS — N183 Chronic kidney disease, stage 3 unspecified: Secondary | ICD-10-CM | POA: Insufficient documentation

## 2024-01-23 DIAGNOSIS — E559 Vitamin D deficiency, unspecified: Secondary | ICD-10-CM | POA: Insufficient documentation

## 2024-01-23 LAB — CBC WITH DIFF
BASOPHIL #: 0.1 10*3/uL (ref 0.00–0.10)
BASOPHIL %: 1 % (ref 0–1)
EOSINOPHIL #: 0.2 10*3/uL (ref 0.00–0.50)
EOSINOPHIL %: 3 % (ref 1–7)
HCT: 42.8 % — ABNORMAL HIGH (ref 31.2–41.9)
HGB: 14.2 g/dL (ref 10.9–14.3)
LYMPHOCYTE #: 1.8 10*3/uL (ref 1.10–3.10)
LYMPHOCYTE %: 28 % (ref 16–46)
MCH: 31.9 pg (ref 24.7–32.8)
MCHC: 33.2 g/dL (ref 32.3–35.6)
MCV: 96.1 fL — ABNORMAL HIGH (ref 75.5–95.3)
MONOCYTE #: 0.4 10*3/uL (ref 0.20–0.90)
MONOCYTE %: 6 % (ref 4–11)
MPV: 8.1 fL (ref 7.9–10.8)
NEUTROPHIL #: 3.9 10*3/uL (ref 1.90–8.20)
NEUTROPHIL %: 62 % (ref 43–77)
PLATELETS: 188 10*3/uL (ref 140–440)
RBC: 4.46 10*6/uL (ref 3.63–4.92)
RDW: 13.6 % (ref 12.3–17.7)
WBC: 6.3 10*3/uL (ref 3.8–11.8)

## 2024-01-23 LAB — VITAMIN D 25 TOTAL: VITAMIN D 25, TOTAL: 27.8 ng/mL — ABNORMAL LOW (ref 30.00–100.00)

## 2024-01-23 LAB — BASIC METABOLIC PANEL
ANION GAP: 8 mmol/L (ref 4–13)
BUN/CREA RATIO: 15 (ref 6–22)
BUN: 18 mg/dL (ref 7–25)
CALCIUM: 9.4 mg/dL (ref 8.6–10.3)
CHLORIDE: 109 mmol/L — ABNORMAL HIGH (ref 98–107)
CO2 TOTAL: 25 mmol/L (ref 21–31)
CREATININE: 1.17 mg/dL (ref 0.60–1.30)
ESTIMATED GFR: 48 mL/min/{1.73_m2} — ABNORMAL LOW (ref >59)
GLUCOSE: 84 mg/dL (ref 74–109)
OSMOLALITY, CALCULATED: 284 mosm/kg (ref 270–290)
POTASSIUM: 4.1 mmol/L (ref 3.5–5.1)
SODIUM: 142 mmol/L (ref 136–145)

## 2024-01-23 LAB — MICROALBUMIN/CREATININE RATIO, URINE, RANDOM
CREATININE RANDOM URINE: 189 mg/dL — ABNORMAL HIGH (ref 30–125)
MICROALBUMIN RANDOM URINE: 1 mg/dL
MICROALBUMIN/CREATININE RATIO RANDOM URINE: 5.3 mg/g

## 2024-01-23 LAB — PROTEIN/CREATININE RATIO, URINE, RANDOM
CREATININE RANDOM URINE: 189 mg/dL — ABNORMAL HIGH (ref 30–125)
PROTEIN RANDOM URINE: 10 mg/dL — ABNORMAL LOW (ref 50–80)
PROTEIN/CREATININE RATIO RANDOM URINE: 0.053 mg/mg (ref 0.000–200.000)

## 2024-01-23 LAB — MAGNESIUM: MAGNESIUM: 2.2 mg/dL (ref 1.9–2.7)

## 2024-01-23 LAB — URIC ACID: URIC ACID: 5.7 mg/dL (ref 2.3–7.6)

## 2024-01-23 LAB — PARATHYROID HORMONE (PTH): PTH: 103.4 pg/mL — ABNORMAL HIGH (ref 12.0–88.0)

## 2024-01-30 ENCOUNTER — Encounter (INDEPENDENT_AMBULATORY_CARE_PROVIDER_SITE_OTHER): Payer: Medicare Other

## 2024-02-03 ENCOUNTER — Encounter (INDEPENDENT_AMBULATORY_CARE_PROVIDER_SITE_OTHER): Payer: Self-pay

## 2024-02-03 ENCOUNTER — Other Ambulatory Visit: Payer: Self-pay

## 2024-02-03 ENCOUNTER — Ambulatory Visit (INDEPENDENT_AMBULATORY_CARE_PROVIDER_SITE_OTHER): Payer: Medicare Other

## 2024-02-03 VITALS — BP 134/58 | HR 65 | Ht 67.0 in | Wt 191.0 lb

## 2024-02-03 DIAGNOSIS — I129 Hypertensive chronic kidney disease with stage 1 through stage 4 chronic kidney disease, or unspecified chronic kidney disease: Secondary | ICD-10-CM

## 2024-02-03 DIAGNOSIS — I10 Essential (primary) hypertension: Secondary | ICD-10-CM

## 2024-02-03 DIAGNOSIS — Z7901 Long term (current) use of anticoagulants: Secondary | ICD-10-CM

## 2024-02-03 DIAGNOSIS — R7303 Prediabetes: Secondary | ICD-10-CM

## 2024-02-03 DIAGNOSIS — N1831 Chronic kidney disease, stage 3a (CMS HCC): Secondary | ICD-10-CM

## 2024-02-03 DIAGNOSIS — E559 Vitamin D deficiency, unspecified: Secondary | ICD-10-CM

## 2024-02-03 NOTE — Nursing Note (Signed)
 Follow up labs

## 2024-02-03 NOTE — Progress Notes (Signed)
 NEPHROLOGY, [facility]  [address] [address]    Progress Note    Name: Donna Roberts MRN:  [id number]   Date: 02/03/2024 DOB:  [birth year] (78 y.o.)              Nephrology Out  Patient Visit         HPI : 78 y.o. female here for a follow up chronic kidney disease.  Creatinine 1.17 GFR 48 stage IIIA. Past medical history of chronic kidney disease, hypertension and prediabetes. She is currently taking Xarelto 20 mg daily. Denies hematuria and dysuria. Reports no issues with edema.  Denies current NSAID use.  Denies nausea and vomiting.     ROS:     Assessment was negative except what mentioned in in the HPI.     OBJECTIVE:   BP (!) 134/58   Pulse 65   Ht 1.702 m (5\' 7" )   Wt 86.6 kg (191 lb)   BMI 29.91 kg/m       General:  NAD, AAOx3  HEENT:  EOMI,  no icterus  NECK: No increased JVD.  Normal inspection   HEART:   Regular rhythm. No murmurs or rubs. No chest wall tenderness   LUNGS: Clear to auscultation bilateral. No wheezes, rales, or rhonchi.   ABDOMEN: +BS, Soft, nontender and nondistended. No rebound or guarding present.   EXTREMITIES: No edema. No cyanosis or clubbing.No asterixis    NEURO : Normal speech, EOMI.       LABORATORY DATA:   Lab Results   Component Value Date    BUN 18 01/23/2024    BUN 21 07/24/2023    BUN 29 (H) 04/26/2023    CREATININE 1.17 01/23/2024    CREATININE 1.33 (H) 07/24/2023    CREATININE 1.25 04/26/2023    BUNCRRATIO 15 01/23/2024    BUNCRRATIO 16 07/24/2023    BUNCRRATIO 23 (H) 04/26/2023    GFR 48 (L) 01/23/2024    GFR 41 (L) 07/24/2023    GFR 45 (L) 04/26/2023    SODIUM 142 01/23/2024    SODIUM 142 07/24/2023    SODIUM 145 04/26/2023    POTASSIUM 4.1 01/23/2024    POTASSIUM 4.2 07/24/2023    POTASSIUM 4.3 04/26/2023    CHLORIDE 109 (H) 01/23/2024    CHLORIDE 112 (H) 07/24/2023    CHLORIDE 110 (H) 04/26/2023    CO2 25 01/23/2024    CO2 23 07/24/2023    CO2 24 04/26/2023    ANIONGAP 8 01/23/2024    ANIONGAP 7 07/24/2023    ANIONGAP 11 04/26/2023     CALCIUM 9.4 01/23/2024    CALCIUM 9.4 07/24/2023    CALCIUM 10.1 04/26/2023    HGB 14.2 01/23/2024    HGB 14.3 07/24/2023    HGB 14.3 04/26/2023    HCT 42.8 (H) 01/23/2024    HCT 42.3 (H) 07/24/2023    HCT 41.8 04/26/2023    INTACTPTH 103.4 (H) 01/23/2024    INTACTPTH 79.7 07/24/2023    INTACTPTH 70.1 04/26/2023           MEDICATIONS:  No outpatient medications have been marked as taking for the 02/03/24 encounter (Office Visit) with Cydney Ok, NP.     Current Outpatient Medications   Medication Instructions    ARIPiprazole (ABILIFY) 5 mg Oral Tablet aripiprazole 5 mg tablet    atorvastatin (LIPITOR) 80 mg Oral Tablet atorvastatin 80 mg tablet    lamoTRIgine (LAMICTAL) 25 mg Oral Tablet lamotrigine 25 mg tablet  rivaroxaban (XARELTO) 20 mg, Oral    sertraline (ZOLOFT) 100 mg Oral Tablet 07/26/2014 Sertraline Oral  PO 1.0 TABLET(S) daily  07/26/2014  active      ASSESSMENT / PLAN:  ENCOUNTER DIAGNOSES     ICD-10-CM   1. Stage 3a chronic kidney disease (CMS HCC)  N18.31   2. HTN (hypertension), benign  I10   3. Vitamin D deficiency  E55.9           Chronic kidney disease  -Stage IIIa   -Due to diabetes and hypertension  -Creatinine is  1.17/ MWU13  -Baseline creatinine 1.1  -Total protein to creatinine ratio- 0.05  Lab Results   Component Value Date    UPCR 0.053 01/23/2024    UPCR 0.079 07/24/2023      -CBC and a basic metabolic panel  -Return to clinic in 3 months  -Continue low-sodium diet  -balanced Fluid intake 45 oz a day.    -Avoid NSAIDs.  Tylenol only for pain  -Renal imaging:  Unremarkable  -ACEI or ARBS:  consider ACEI or ARB   -Sodium Glucose Cotransporter-2 (SGLT2) Inhibitors: no       CKD bone mineral disease  Lab Results   Component Value Date    INTACTPTH 103.4 (H) 01/23/2024    VITAMIND25 27.80 (L) 01/23/2024    -calcitriol 0.25 mcg daily for 90 days  -PTH   -monitor vitamin-D level            Prediabetes  -A1c goal is less than 7    -Continue current medications  -Diabetic diet  -Increase  activity and exercise as possible  -Monitor A1C          I have discussed with the patient the following issues:  The main goal of treatment is to prevent progression of CKD to complete kidney failure. The best way to do this is to control the underlying cause.  the optimal diet is one similar to the Dietary Approaches to Stop Hypertension (DASH) diet, consisting of fruits, vegetables, legumes, fish, poultry, and whole grains.  Restrict sodium intake to less than 2 g/day      Orders Placed This Encounter    BASIC METABOLIC PANEL    CBC/DIFF    MAGNESIUM    MICROALBUMIN URINE, RANDOM    VITAMIN D 25 TOTAL    URIC ACID    PROTEIN/CREATININE RATIO, URINE, RANDOM    PARATHYROID HORMONE (PTH)       Cydney Ok, NP

## 2024-02-04 MED ORDER — CALCITRIOL 0.25 MCG CAPSULE
0.2500 ug | ORAL_CAPSULE | Freq: Every day | ORAL | 0 refills | Status: AC
Start: 2024-02-04 — End: 2024-05-04

## 2024-05-26 ENCOUNTER — Other Ambulatory Visit

## 2024-05-26 ENCOUNTER — Other Ambulatory Visit: Payer: Self-pay

## 2024-05-26 ENCOUNTER — Ambulatory Visit (INDEPENDENT_AMBULATORY_CARE_PROVIDER_SITE_OTHER): Payer: Self-pay

## 2024-05-26 DIAGNOSIS — I10 Essential (primary) hypertension: Secondary | ICD-10-CM | POA: Insufficient documentation

## 2024-05-26 DIAGNOSIS — E559 Vitamin D deficiency, unspecified: Secondary | ICD-10-CM | POA: Insufficient documentation

## 2024-05-26 DIAGNOSIS — N1831 Chronic kidney disease, stage 3a: Secondary | ICD-10-CM

## 2024-05-26 DIAGNOSIS — I129 Hypertensive chronic kidney disease with stage 1 through stage 4 chronic kidney disease, or unspecified chronic kidney disease: Secondary | ICD-10-CM

## 2024-05-26 LAB — PARATHYROID HORMONE (PTH): PTH: 123.4 pg/mL — ABNORMAL HIGH (ref 12.0–88.0)

## 2024-05-26 LAB — BASIC METABOLIC PANEL
ANION GAP: 8 mmol/L (ref 4–13)
BUN/CREA RATIO: 17 (ref 6–22)
BUN: 21 mg/dL (ref 7–25)
CALCIUM: 9.5 mg/dL (ref 8.6–10.3)
CHLORIDE: 111 mmol/L — ABNORMAL HIGH (ref 98–107)
CO2 TOTAL: 23 mmol/L (ref 21–31)
CREATININE: 1.21 mg/dL (ref 0.60–1.30)
ESTIMATED GFR: 46 mL/min/{1.73_m2} — ABNORMAL LOW (ref >59)
GLUCOSE: 87 mg/dL (ref 74–109)
OSMOLALITY, CALCULATED: 286 mosm/kg (ref 270–290)
POTASSIUM: 4 mmol/L (ref 3.5–5.1)
SODIUM: 142 mmol/L (ref 136–145)

## 2024-05-26 LAB — PROTEIN/CREATININE RATIO, URINE, RANDOM
CREATININE RANDOM URINE: 207 mg/dL — ABNORMAL HIGH (ref 30–125)
PROTEIN RANDOM URINE: 15 mg/dL — ABNORMAL LOW (ref 50–80)
PROTEIN/CREATININE RATIO RANDOM URINE: 0.072 mg/mg (ref 0.000–200.000)

## 2024-05-26 LAB — URIC ACID: URIC ACID: 5.9 mg/dL (ref 2.3–7.6)

## 2024-05-26 LAB — MAGNESIUM: MAGNESIUM: 2.3 mg/dL (ref 1.9–2.7)

## 2024-05-26 LAB — MICROALBUMIN URINE, RANDOM: MICROALBUMIN RANDOM URINE: 1.9 mg/dL

## 2024-05-26 LAB — VITAMIN D 25 TOTAL: VITAMIN D 25, TOTAL: 25.99 ng/mL — ABNORMAL LOW (ref 30.00–100.00)

## 2024-06-02 ENCOUNTER — Other Ambulatory Visit: Payer: Self-pay

## 2024-06-02 ENCOUNTER — Other Ambulatory Visit (HOSPITAL_COMMUNITY)

## 2024-06-02 ENCOUNTER — Encounter (INDEPENDENT_AMBULATORY_CARE_PROVIDER_SITE_OTHER): Payer: Self-pay

## 2024-06-02 ENCOUNTER — Ambulatory Visit

## 2024-06-02 ENCOUNTER — Ambulatory Visit (INDEPENDENT_AMBULATORY_CARE_PROVIDER_SITE_OTHER): Payer: Self-pay

## 2024-06-02 VITALS — BP 118/51 | HR 83 | Ht 67.0 in | Wt 189.0 lb

## 2024-06-02 DIAGNOSIS — I10 Essential (primary) hypertension: Secondary | ICD-10-CM

## 2024-06-02 DIAGNOSIS — E559 Vitamin D deficiency, unspecified: Secondary | ICD-10-CM

## 2024-06-02 DIAGNOSIS — N1831 Chronic kidney disease, stage 3a: Secondary | ICD-10-CM | POA: Insufficient documentation

## 2024-06-02 DIAGNOSIS — R7303 Prediabetes: Secondary | ICD-10-CM

## 2024-06-02 DIAGNOSIS — I129 Hypertensive chronic kidney disease with stage 1 through stage 4 chronic kidney disease, or unspecified chronic kidney disease: Secondary | ICD-10-CM

## 2024-06-02 DIAGNOSIS — N250 Renal osteodystrophy: Secondary | ICD-10-CM

## 2024-06-02 MED ORDER — CALCITRIOL 0.25 MCG CAPSULE
0.2500 ug | ORAL_CAPSULE | Freq: Every day | ORAL | 0 refills | Status: AC
Start: 2024-06-02 — End: 2024-08-31

## 2024-06-02 NOTE — Nursing Note (Signed)
 Follow up labs

## 2024-06-02 NOTE — Progress Notes (Signed)
 NEPHROLOGY, [facility]  [address]  [place] [address]    Progress Note    Name: Donna Roberts MRN:  [id number]   Date: 06/02/2024 DOB:  1946/06/24 (78 y.o.)              Nephrology Out  Patient Visit         HPI : 78 y.o. female here for a follow up chronic kidney disease.  Creatinine 1.17 GFR 46 stage IIIA. Past medical history of chronic kidney disease, hypertension and prediabetes. 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  Blood pressure  in good control averaging 110s systolic. No edema noted. Denies hematuria and dysuria. Reports no issues with edema.  Denies current NSAID use.  Denies nausea and vomiting.     ROS:     Assessment was negative except what mentioned in in the HPI.     OBJECTIVE:   BP (!) 118/51   Pulse 83   Ht 1.702 m (5' 7)   Wt 85.7 kg (189 lb)   BMI 29.60 kg/m       General:  NAD, AAOx3  HEENT:  EOMI,  no icterus  NECK: No increased JVD.  Normal inspection   HEART:   Regular rhythm. No murmurs or rubs. No chest wall tenderness   LUNGS: Clear to auscultation bilateral. No wheezes, rales, or rhonchi.   ABDOMEN: +BS, Soft, nontender and nondistended. No rebound or guarding present.   EXTREMITIES: No edema. No cyanosis or clubbing.No asterixis    NEURO : Normal speech, EOMI.       LABORATORY DATA:   Lab Results   Component Value Date    BUN 21 05/26/2024    BUN 18 01/23/2024    BUN 21 07/24/2023    CREATININE 1.21 05/26/2024    CREATININE 1.17 01/23/2024    CREATININE 1.33 (H) 07/24/2023    BUNCRRATIO 17 05/26/2024    BUNCRRATIO 15 01/23/2024    BUNCRRATIO 16 07/24/2023    GFR 46 (L) 05/26/2024    GFR 48 (L) 01/23/2024    GFR 41 (L) 07/24/2023    SODIUM 142 05/26/2024    SODIUM 142 01/23/2024    SODIUM 142 07/24/2023    POTASSIUM 4.0 05/26/2024    POTASSIUM 4.1 01/23/2024    POTASSIUM 4.2 07/24/2023    CHLORIDE 111 (H) 05/26/2024    CHLORIDE 109 (H) 01/23/2024    CHLORIDE 112 (H) 07/24/2023    CO2 23 05/26/2024    CO2 25  01/23/2024    CO2 23 07/24/2023    ANIONGAP 8 05/26/2024    ANIONGAP 8 01/23/2024    ANIONGAP 7 07/24/2023    CALCIUM 9.5 05/26/2024    CALCIUM 9.4 01/23/2024    CALCIUM 9.4 07/24/2023    HGB 14.0 06/02/2024    HGB 14.2 01/23/2024    HGB 14.3 07/24/2023    HCT 41.5 06/02/2024    HCT 42.8 (H) 01/23/2024    HCT 42.3 (H) 07/24/2023    INTACTPTH 123.4 (H) 05/26/2024    INTACTPTH 103.4 (H) 01/23/2024    INTACTPTH 79.7 07/24/2023           MEDICATIONS:  Outpatient Medications Marked as Taking for the 06/02/24 encounter (Office Visit) with Cathryne Iha, NP   Medication Sig    ARIPiprazole (ABILIFY) 5 mg Oral Tablet aripiprazole 5 mg tablet    atorvastatin (LIPITOR) 40 mg Oral Tablet Take 1 Tablet (40 mg total) by mouth Every  evening    lamoTRIgine (LAMICTAL) 25 mg Oral Tablet lamotrigine 25 mg tablet    levothyroxine (SYNTHROID) 25 mcg Oral Tablet Take 1 Tablet (25 mcg total) by mouth Every morning    rivaroxaban (XARELTO) 20 mg Oral Tablet Take 1 Tablet (20 mg total) by mouth    sertraline (ZOLOFT) 100 mg Oral Tablet 07/26/2014 Sertraline Oral  PO 1.0 TABLET(S) daily  07/26/2014  active     Current Outpatient Medications   Medication Instructions    ARIPiprazole (ABILIFY) 5 mg Oral Tablet aripiprazole 5 mg tablet    atorvastatin (LIPITOR) 40 mg, EVERY EVENING    lamoTRIgine (LAMICTAL) 25 mg Oral Tablet lamotrigine 25 mg tablet    levothyroxine (SYNTHROID) 25 mcg, EVERY MORNING    rivaroxaban (XARELTO) 20 mg    sertraline (ZOLOFT) 100 mg Oral Tablet 07/26/2014 Sertraline Oral  PO 1.0 TABLET(S) daily  07/26/2014  active      ASSESSMENT / PLAN:  ENCOUNTER DIAGNOSES     ICD-10-CM   1. Stage 3a chronic kidney disease (CMS HCC)  N18.31   2. HTN (hypertension), benign  I10   3. Vitamin D  deficiency  E55.9           Chronic kidney disease  -Stage IIIa   -Due to diabetes and hypertension  -Creatinine is  1.17/ HQM51  -Baseline creatinine 1.1  -Total protein to creatinine ratio- 0.07   Lab Results   Component Value Date    UPCR  0.072 05/26/2024    UPCR 0.053 01/23/2024      -CBC and a basic metabolic panel  -Return to clinic in 3 months  -Continue low-sodium diet  -balanced Fluid intake 45 oz a day.    -Avoid NSAIDs.  Tylenol only for pain  -Renal imaging:  Unremarkable  -ACEI or ARBS:  consider ACEI or ARB   -Sodium Glucose Cotransporter-2 (SGLT2) Inhibitors: no       CKD bone mineral disease  Lab Results   Component Value Date    INTACTPTH 123.4 (H) 05/26/2024    [id number] 25.99 (L) 05/26/2024    -calcitriol  0.25 mcg daily for 90 days  -PTH   -monitor vitamin-D level            Prediabetes  -A1c goal is less than 7    -Continue current medications  -Diabetic diet  -Increase activity and exercise as possible  -Monitor A1C          I have discussed with the patient the following issues:  The main goal of treatment is to prevent progression of CKD to complete kidney failure. The best way to do this is to control the underlying cause.  the optimal diet is one similar to the Dietary Approaches to Stop Hypertension (DASH) diet, consisting of fruits, vegetables, legumes, fish, poultry, and whole grains.  Restrict sodium intake to less than 2 g/day      Orders Placed This Encounter    BASIC METABOLIC PANEL    CBC/DIFF    MAGNESIUM    VITAMIN D  25 TOTAL    URIC ACID    PROTEIN/CREATININE RATIO, URINE, RANDOM    PARATHYROID  HORMONE (PTH)    MICROALBUMIN/CREATININE RATIO, URINE, RANDOM       Suzen Risser, NP

## 2024-08-26 ENCOUNTER — Other Ambulatory Visit (INDEPENDENT_AMBULATORY_CARE_PROVIDER_SITE_OTHER): Payer: Self-pay

## 2024-08-26 NOTE — Telephone Encounter (Signed)
Therapy Completed.

## 2024-09-25 ENCOUNTER — Ambulatory Visit (INDEPENDENT_AMBULATORY_CARE_PROVIDER_SITE_OTHER): Payer: Self-pay

## 2024-09-25 ENCOUNTER — Other Ambulatory Visit: Payer: Self-pay

## 2024-09-25 ENCOUNTER — Other Ambulatory Visit

## 2024-09-25 DIAGNOSIS — I10 Essential (primary) hypertension: Secondary | ICD-10-CM | POA: Insufficient documentation

## 2024-09-25 DIAGNOSIS — I129 Hypertensive chronic kidney disease with stage 1 through stage 4 chronic kidney disease, or unspecified chronic kidney disease: Secondary | ICD-10-CM

## 2024-09-25 DIAGNOSIS — N1831 Chronic kidney disease, stage 3a: Secondary | ICD-10-CM

## 2024-09-25 DIAGNOSIS — E559 Vitamin D deficiency, unspecified: Secondary | ICD-10-CM

## 2024-09-25 LAB — CBC WITH DIFF
BASOPHIL #: 0.1 x10ˆ3/uL (ref 0.00–0.10)
BASOPHIL %: 1 % (ref 0–1)
EOSINOPHIL #: 0.1 x10ˆ3/uL (ref 0.00–0.50)
EOSINOPHIL %: 2 % (ref 1–7)
HCT: 41.3 % (ref 31.2–41.9)
HGB: 14.2 g/dL (ref 10.9–14.3)
LYMPHOCYTE #: 1.8 x10ˆ3/uL (ref 1.10–3.10)
LYMPHOCYTE %: 28 % (ref 16–46)
MCH: 32.3 pg (ref 24.7–32.8)
MCHC: 34.3 g/dL (ref 32.3–35.6)
MCV: 94.2 fL (ref 75.5–95.3)
MONOCYTE #: 0.4 x10ˆ3/uL (ref 0.20–0.90)
MONOCYTE %: 7 % (ref 4–11)
MPV: 8.9 fL (ref 7.9–10.8)
NEUTROPHIL #: 3.9 x10ˆ3/uL (ref 1.90–8.20)
NEUTROPHIL %: 62 % (ref 43–77)
PLATELETS: 222 x10ˆ3/uL (ref 140–440)
RBC: 4.39 x10ˆ6/uL (ref 3.63–4.92)
RDW: 13.4 % (ref 12.3–17.7)
WBC: 6.4 x10ˆ3/uL (ref 3.8–11.8)

## 2024-09-25 LAB — MAGNESIUM: MAGNESIUM: 2.2 mg/dL (ref 1.9–2.7)

## 2024-09-25 LAB — URIC ACID: URIC ACID: 6 mg/dL (ref 2.3–7.6)

## 2024-09-25 LAB — BASIC METABOLIC PANEL
ANION GAP: 8 mmol/L (ref 4–13)
BUN/CREA RATIO: 17 (ref 6–22)
BUN: 21 mg/dL (ref 7–25)
CALCIUM: 9.6 mg/dL (ref 8.6–10.3)
CHLORIDE: 108 mmol/L — ABNORMAL HIGH (ref 98–107)
CO2 TOTAL: 25 mmol/L (ref 21–31)
CREATININE: 1.23 mg/dL (ref 0.60–1.30)
ESTIMATED GFR: 45 mL/min/1.73mˆ2 — ABNORMAL LOW (ref >59)
GLUCOSE: 82 mg/dL (ref 74–109)
OSMOLALITY, CALCULATED: 283 mosm/kg (ref 270–290)
POTASSIUM: 4.2 mmol/L (ref 3.5–5.1)
SODIUM: 141 mmol/L (ref 136–145)

## 2024-09-25 LAB — PROTEIN/CREATININE RATIO, URINE, RANDOM
CREATININE RANDOM URINE: 187 mg/dL — ABNORMAL HIGH (ref 30–125)
PROTEIN RANDOM URINE: 13 mg/dL — ABNORMAL LOW (ref 50–80)
PROTEIN/CREATININE RATIO RANDOM URINE: 0.07 mg/mg (ref 0.000–200.000)

## 2024-09-25 LAB — MICROALBUMIN/CREATININE RATIO, URINE, RANDOM
CREATININE RANDOM URINE: 187 mg/dL — ABNORMAL HIGH (ref 30–125)
MICROALBUMIN RANDOM URINE: 1.5 mg/dL
MICROALBUMIN/CREATININE RATIO RANDOM URINE: 8 mg/g

## 2024-09-25 LAB — VITAMIN D 25 TOTAL: VITAMIN D 25, TOTAL: 23.63 ng/mL — ABNORMAL LOW (ref 30.00–100.00)

## 2024-09-25 LAB — PARATHYROID HORMONE (PTH): PTH: 67.1 pg/mL (ref 12.0–88.0)

## 2024-10-02 ENCOUNTER — Ambulatory Visit (INDEPENDENT_AMBULATORY_CARE_PROVIDER_SITE_OTHER): Payer: Self-pay

## 2024-10-02 ENCOUNTER — Other Ambulatory Visit: Payer: Self-pay

## 2024-10-02 VITALS — BP 141/93 | HR 70 | Ht 67.0 in | Wt 191.2 lb

## 2024-10-02 DIAGNOSIS — E559 Vitamin D deficiency, unspecified: Secondary | ICD-10-CM

## 2024-10-02 DIAGNOSIS — I10 Essential (primary) hypertension: Secondary | ICD-10-CM

## 2024-10-02 DIAGNOSIS — N1831 Chronic kidney disease, stage 3a: Secondary | ICD-10-CM

## 2024-10-02 MED ORDER — LOSARTAN 25 MG TABLET
25.0000 mg | ORAL_TABLET | Freq: Every day | ORAL | 1 refills | Status: AC
Start: 2024-10-02 — End: 2025-03-31

## 2024-10-02 MED ORDER — ERGOCALCIFEROL (VITAMIN D2) 1,250 MCG (50,000 UNIT) CAPSULE
50000.0000 [IU] | ORAL_CAPSULE | ORAL | 0 refills | Status: AC
Start: 2024-10-02 — End: ?

## 2024-10-02 NOTE — Progress Notes (Signed)
 NEPHROLOGY, [facility]  [address]  [place] [address]    Progress Note    Name: Donna Roberts MRN:  [id number]   Date: 10/02/2024 DOB:  [birth year] (78 y.o.)              Nephrology Out  Patient Visit         HPI : 78 y.o. female here for a follow up chronic kidney disease.  Creatinine 1.23 GFR 45  stage IIIA. Past medical history of chronic kidney disease, hypertension and prediabetes. 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  Blood pressure slightly elevated this visit.  States blood pressure averages 130s systolic.Donna Roberts  Reports no problems with edema denies hematuria and dysuria.  Reports no nausea and vomiting.  Not currently taking NSAIDs.    ROS:     Assessment was negative except what mentioned in in the HPI.     OBJECTIVE:   BP (!) 141/93   Pulse 70   Ht 1.702 m (5' 7)   Wt 86.7 kg (191 lb 3.2 oz)   BMI 29.95 kg/m       General:  NAD, AAOx3  HEENT:  EOMI,  no icterus  NECK: No increased JVD.  Normal inspection   HEART:   Regular rhythm. No murmurs or rubs. No chest wall tenderness   LUNGS: Clear to auscultation bilateral. No wheezes, rales, or rhonchi.   ABDOMEN: +BS, Soft, nontender and nondistended. No rebound or guarding present.   EXTREMITIES: No edema. No cyanosis or clubbing.No asterixis    NEURO : Normal speech, EOMI.       LABORATORY DATA:   Lab Results   Component Value Date    BUN 21 09/25/2024    BUN 21 05/26/2024    BUN 18 01/23/2024    CREATININE 1.23 09/25/2024    CREATININE 1.21 05/26/2024    CREATININE 1.17 01/23/2024    BUNCRRATIO 17 09/25/2024    BUNCRRATIO 17 05/26/2024    BUNCRRATIO 15 01/23/2024    GFR 45 (L) 09/25/2024    GFR 46 (L) 05/26/2024    GFR 48 (L) 01/23/2024    SODIUM 141 09/25/2024    SODIUM 142 05/26/2024    SODIUM 142 01/23/2024    POTASSIUM 4.2 09/25/2024    POTASSIUM 4.0 05/26/2024    POTASSIUM 4.1 01/23/2024    CHLORIDE 108 (H) 09/25/2024    CHLORIDE 111 (H) 05/26/2024    CHLORIDE 109 (H) 01/23/2024     CO2 25 09/25/2024    CO2 23 05/26/2024    CO2 25 01/23/2024    ANIONGAP 8 09/25/2024    ANIONGAP 8 05/26/2024    ANIONGAP 8 01/23/2024    CALCIUM 9.6 09/25/2024    CALCIUM 9.5 05/26/2024    CALCIUM 9.4 01/23/2024    HGB 14.2 09/25/2024    HGB 14.0 06/02/2024    HGB 14.2 01/23/2024    HCT 41.3 09/25/2024    HCT 41.5 06/02/2024    HCT 42.8 (H) 01/23/2024    INTACTPTH 67.1 09/25/2024    INTACTPTH 123.4 (H) 05/26/2024    INTACTPTH 103.4 (H) 01/23/2024           MEDICATIONS:  Outpatient Medications Marked as Taking for the 10/02/24 encounter (Office Visit) with Cathryne Iha, APRN, CNP   Medication Sig    ARIPiprazole (ABILIFY) 5 mg Oral Tablet aripiprazole 5 mg tablet    atorvastatin (LIPITOR) 40 mg Oral Tablet Take 1 Tablet (40 mg  total) by mouth Every evening    ergocalciferol , vitamin D2, (DRISDOL ) 1,250 mcg (50,000 unit) Oral Capsule Take 1 Capsule (50,000 Units total) by mouth Every 7 days Indications: low vitamin D  levels    lamoTRIgine (LAMICTAL) 25 mg Oral Tablet lamotrigine 25 mg tablet    levothyroxine (SYNTHROID) 25 mcg Oral Tablet Take 1 Tablet (25 mcg total) by mouth Every morning    losartan (COZAAR) 25 mg Oral Tablet Take 1 Tablet (25 mg total) by mouth Daily    rivaroxaban (XARELTO) 20 mg Oral Tablet Take 1 Tablet (20 mg total) by mouth    sertraline (ZOLOFT) 100 mg Oral Tablet 07/26/2014 Sertraline Oral  PO 1.0 TABLET(S) daily  07/26/2014  active     Current Outpatient Medications   Medication Instructions    ARIPiprazole (ABILIFY) 5 mg Oral Tablet aripiprazole 5 mg tablet    atorvastatin (LIPITOR) 40 mg, EVERY EVENING    ergocalciferol  (vitamin D2) (DRISDOL ) 50,000 Units, Oral, EVERY 7 DAYS    lamoTRIgine (LAMICTAL) 25 mg Oral Tablet lamotrigine 25 mg tablet    levothyroxine (SYNTHROID) 25 mcg, EVERY MORNING    losartan (COZAAR) 25 mg, Oral, Daily    rivaroxaban (XARELTO) 20 mg    sertraline (ZOLOFT) 100 mg Oral Tablet 07/26/2014 Sertraline Oral  PO 1.0 TABLET(S) daily  07/26/2014  active       ASSESSMENT / PLAN:  ENCOUNTER DIAGNOSES     ICD-10-CM   1. Stage 3a chronic kidney disease (CMS HCC)  N18.31   2. Hypertension, unspecified type  I10   3. Vitamin D  deficiency  E55.9             Chronic kidney disease  -Stage IIIa   -Due to hypertension and history of NSAID use  -Creatinine is  1.23 GFR 45  -Baseline creatinine 1.1  -Total protein to creatinine ratio- 0.07   Lab Results   Component Value Date    UPCR 0.070 09/25/2024    UPCR 0.072 05/26/2024      -CBC and a basic metabolic panel  -Return to clinic in 3 months  -Continue low-sodium diet  -balanced Fluid intake 45 oz a day.    -Avoid NSAIDs.  Tylenol only for pain  -Renal imaging:  Unremarkable  -ACEI or ARBS:  consider ACEI or ARB   -Sodium Glucose Cotransporter-2 (SGLT2) Inhibitors: no       CKD bone mineral disease  Lab Results   Component Value Date    INTACTPTH 67.1 09/25/2024    VITAMIND25 23.63 (L) 09/25/2024   -vitamin-D 50000 units weekly for 2 months  -PTH   -monitor vitamin-D level            Prediabetes  -A1c goal is less than 7    -Continue current medications  -Diabetic diet  -Increase activity and exercise as possible  -Monitor A1C          I have discussed with the patient the following issues:  The main goal of treatment is to prevent progression of CKD to complete kidney failure. The best way to do this is to control the underlying cause.  the optimal diet is one similar to the Dietary Approaches to Stop Hypertension (DASH) diet, consisting of fruits, vegetables, legumes, fish, poultry, and whole grains.  Restrict sodium intake to less than 2 g/day      Orders Placed This Encounter    BASIC METABOLIC PANEL    CBC/DIFF    MAGNESIUM    MICROALBUMIN/CREATININE RATIO, URINE, RANDOM  PARATHYROID  HORMONE (PTH)    PROTEIN/CREATININE RATIO, URINE, RANDOM    URIC ACID    VITAMIN D  25 TOTAL    losartan (COZAAR) 25 mg Oral Tablet    ergocalciferol , vitamin D2, (DRISDOL ) 1,250 mcg (50,000 unit) Oral Capsule       Zenna Traister, NP

## 2024-12-11 ENCOUNTER — Other Ambulatory Visit (INDEPENDENT_AMBULATORY_CARE_PROVIDER_SITE_OTHER): Payer: Self-pay

## 2024-12-14 NOTE — Telephone Encounter (Signed)
 Done with Treatment

## 2025-01-01 ENCOUNTER — Other Ambulatory Visit

## 2025-01-01 ENCOUNTER — Other Ambulatory Visit: Payer: Self-pay

## 2025-01-01 ENCOUNTER — Ambulatory Visit (INDEPENDENT_AMBULATORY_CARE_PROVIDER_SITE_OTHER): Payer: Self-pay

## 2025-01-01 DIAGNOSIS — E559 Vitamin D deficiency, unspecified: Secondary | ICD-10-CM

## 2025-01-01 DIAGNOSIS — N1831 Chronic kidney disease, stage 3a: Secondary | ICD-10-CM

## 2025-01-01 LAB — MAGNESIUM: MAGNESIUM: 2.4 mg/dL (ref 1.9–2.7)

## 2025-01-01 LAB — CBC WITH DIFF
BASOPHIL #: 0 10*3/uL (ref 0.00–0.10)
BASOPHIL %: 1 % (ref 0–1)
EOSINOPHIL #: 0.1 10*3/uL (ref 0.00–0.50)
EOSINOPHIL %: 1 % (ref 1–7)
HCT: 43 % — ABNORMAL HIGH (ref 31.2–41.9)
LYMPHOCYTE %: 28 % (ref 16–46)
MCV: 93.7 fL (ref 75.5–95.3)
MONOCYTE %: 8 % (ref 4–11)
NEUTROPHIL #: 3.7 10*3/uL (ref 1.90–8.20)
NEUTROPHIL %: 62 % (ref 43–77)
RDW: 13.4 % (ref 12.3–17.7)
WBC: 6 10*3/uL (ref 3.8–11.8)

## 2025-01-01 LAB — URIC ACID: URIC ACID: 6.5 mg/dL (ref 2.3–7.6)

## 2025-01-01 LAB — PARATHYROID HORMONE (PTH): PTH: 64.1 pg/mL (ref 12.0–88.0)

## 2025-01-01 LAB — VITAMIN D 25 TOTAL: VITAMIN D 25, TOTAL: 36.02 ng/mL (ref 30.00–100.00)

## 2025-01-01 LAB — PROTEIN/CREATININE RATIO, URINE, RANDOM: PROTEIN/CREATININE RATIO RANDOM URINE: 0.134 mg/mg (ref 0.000–200.000)

## 2025-01-01 LAB — BASIC METABOLIC PANEL
BUN: 16 mg/dL (ref 7–25)
CREATININE: 1.32 mg/dL — ABNORMAL HIGH (ref 0.60–1.30)

## 2025-01-08 ENCOUNTER — Encounter (INDEPENDENT_AMBULATORY_CARE_PROVIDER_SITE_OTHER): Payer: Self-pay

## 2025-01-14 ENCOUNTER — Encounter (INDEPENDENT_AMBULATORY_CARE_PROVIDER_SITE_OTHER): Payer: Self-pay

## 2025-01-14 ENCOUNTER — Ambulatory Visit (INDEPENDENT_AMBULATORY_CARE_PROVIDER_SITE_OTHER)

## 2025-01-14 ENCOUNTER — Other Ambulatory Visit: Payer: Self-pay

## 2025-01-14 VITALS — BP 148/72 | HR 73 | Ht 67.0 in | Wt 189.0 lb

## 2025-01-14 DIAGNOSIS — I10 Essential (primary) hypertension: Secondary | ICD-10-CM

## 2025-01-14 DIAGNOSIS — N1832 Chronic kidney disease, stage 3b: Secondary | ICD-10-CM

## 2025-01-14 DIAGNOSIS — E559 Vitamin D deficiency, unspecified: Secondary | ICD-10-CM

## 2025-01-14 NOTE — Progress Notes (Unsigned)
 NEPHROLOGY, [facility]  [address]  [place] [address]    Progress Note    Name: Donna Roberts MRN:  [id number]   Date: 01/14/2025 DOB:  January 08, 1946 (79 y.o.)              Nephrology Out  Patient Visit         HPI : 79 y.o. female here for a follow up chronic kidney disease.  Creatinine 1.32 GFR 41   stage IIIb. 1.2  Baseline creatine: Past medical history of chronic kidney disease, hypertension and prediabetes. 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  Blood pressure slightly elevated this visit.  States blood pressure averages 130s systolic.SABRA  Reports no problems with edema denies hematuria and dysuria.  Reports no nausea and vomiting.  Not currently taking NSAIDs.    ROS:     Assessment was negative except what mentioned in in the HPI.     OBJECTIVE:   BP (!) 148/72   Pulse 73   Ht 1.702 m (5' 7)   Wt 85.7 kg (189 lb)   BMI 29.60 kg/m       General:  NAD, AAOx3  HEENT:  EOMI,  no icterus  NECK: No increased JVD.  Normal inspection   HEART:   Regular rhythm. No murmurs or rubs. No chest wall tenderness   LUNGS: Clear to auscultation bilateral. No wheezes, rales, or rhonchi.   ABDOMEN: +BS, Soft, nontender and nondistended. No rebound or guarding present.   EXTREMITIES: No edema. No cyanosis or clubbing.No asterixis    NEURO : Normal speech, EOMI.       LABORATORY DATA:   Lab Results   Component Value Date    BUN 16 01/01/2025    BUN 21 09/25/2024    BUN 21 05/26/2024    CREATININE 1.32 (H) 01/01/2025    CREATININE 1.23 09/25/2024    CREATININE 1.21 05/26/2024    BUNCRRATIO 12 01/01/2025    BUNCRRATIO 17 09/25/2024    BUNCRRATIO 17 05/26/2024    GFR 41 (L) 01/01/2025    GFR 45 (L) 09/25/2024    GFR 46 (L) 05/26/2024    SODIUM 143 01/01/2025    SODIUM 141 09/25/2024    SODIUM 142 05/26/2024    POTASSIUM 4.8 01/01/2025    POTASSIUM 4.2 09/25/2024    POTASSIUM 4.0 05/26/2024    CHLORIDE 110 (H) 01/01/2025    CHLORIDE 108 (H) 09/25/2024    CHLORIDE 111  (H) 05/26/2024    CO2 25 01/01/2025    CO2 25 09/25/2024    CO2 23 05/26/2024    ANIONGAP 8 01/01/2025    ANIONGAP 8 09/25/2024    ANIONGAP 8 05/26/2024    CALCIUM 9.8 01/01/2025    CALCIUM 9.6 09/25/2024    CALCIUM 9.5 05/26/2024    HGB 14.7 (H) 01/01/2025    HGB 14.2 09/25/2024    HGB 14.0 06/02/2024    HCT 43.0 (H) 01/01/2025    HCT 41.3 09/25/2024    HCT 41.5 06/02/2024    INTACTPTH 64.1 01/01/2025    INTACTPTH 67.1 09/25/2024    INTACTPTH 123.4 (H) 05/26/2024           MEDICATIONS:  No outpatient medications have been marked as taking for the 01/14/25 encounter (Office Visit) with Cathryne Iha, APRN, CNP.     Current Outpatient Medications   Medication Instructions    ARIPiprazole (ABILIFY) 5 mg Oral Tablet aripiprazole 5 mg tablet  atorvastatin (LIPITOR) 40 mg, EVERY EVENING    ergocalciferol  (vitamin D2) (DRISDOL ) 50,000 Units, Oral, EVERY 7 DAYS    lamoTRIgine (LAMICTAL) 25 mg Oral Tablet lamotrigine 25 mg tablet    levothyroxine (SYNTHROID) 25 mcg, EVERY MORNING    losartan  (COZAAR ) 25 mg, Oral, Daily    rivaroxaban (XARELTO) 20 mg    sertraline (ZOLOFT) 100 mg Oral Tablet 07/26/2014 Sertraline Oral  PO 1.0 TABLET(S) daily  07/26/2014  active      ASSESSMENT / PLAN:  ENCOUNTER DIAGNOSES     ICD-10-CM   1. 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 Hypertension, unspecified type  I10   3. 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    -Avoid NSAIDs.  Tylenol only for pain  -Renal imaging:  Unremarkable  -losartan  25 mg daily   -Sodium Glucose Cotransporter-2 (SGLT2) Inhibitors: no       CKD bone mineral  disease  Lab Results   Component Value Date    INTACTPTH 64.1 01/01/2025    VITAMIND25 36.02 01/01/2025   -vitamin-D 50000 units weekly for 2 months  -PTH   -monitor vitamin-D level          Hypertension  - Goal blood pressure 130/70  - Blood pressure is Acceptable  - Losartan  25 mg daily  - Low-salt diet          Prediabetes  -A1c goal is less than 7    -Continue current medications  -Diabetic diet  -Increase activity and exercise as possible  -Monitor A1C          I have discussed with the patient the following issues:  The main goal of treatment is to prevent progression of CKD to complete kidney failure. The best way to do this is to control the underlying cause.  the optimal diet is one similar to the Dietary Approaches to Stop Hypertension (DASH) diet, consisting of fruits, vegetables, legumes, fish, poultry, and whole grains.  Restrict sodium intake to less than 2 g/day      Orders Placed This Encounter    BASIC METABOLIC PANEL    CBC/DIFF    MAGNESIUM    MICROALBUMIN/CREATININE RATIO, URINE, RANDOM    PARATHYROID  HORMONE (PTH)    PROTEIN/CREATININE RATIO, URINE, RANDOM    URIC ACID    VITAMIN D  25 TOTAL       Suzen Risser, NP

## 2025-05-07 ENCOUNTER — Other Ambulatory Visit (INDEPENDENT_AMBULATORY_CARE_PROVIDER_SITE_OTHER): Payer: Self-pay

## 2025-05-14 ENCOUNTER — Encounter (INDEPENDENT_AMBULATORY_CARE_PROVIDER_SITE_OTHER): Payer: Self-pay
